# Patient Record
Sex: Male | Born: 1965 | Race: White | Hispanic: No | Marital: Married | Smoking: Never smoker
Health system: Southern US, Community
[De-identification: ages and names within clinical notes are randomized; demographics above are authoritative.]

## PROBLEM LIST (undated history)

## (undated) DIAGNOSIS — Z87442 Personal history of urinary calculi: Secondary | ICD-10-CM

## (undated) DIAGNOSIS — G4730 Sleep apnea, unspecified: Secondary | ICD-10-CM

## (undated) DIAGNOSIS — C801 Malignant (primary) neoplasm, unspecified: Secondary | ICD-10-CM

## (undated) DIAGNOSIS — F32A Depression, unspecified: Secondary | ICD-10-CM

## (undated) DIAGNOSIS — S0285XA Fracture of orbit, unspecified, initial encounter for closed fracture: Secondary | ICD-10-CM

## (undated) DIAGNOSIS — F329 Major depressive disorder, single episode, unspecified: Secondary | ICD-10-CM

## (undated) DIAGNOSIS — K219 Gastro-esophageal reflux disease without esophagitis: Secondary | ICD-10-CM

## (undated) DIAGNOSIS — I959 Hypotension, unspecified: Secondary | ICD-10-CM

## (undated) HISTORY — DX: Major depressive disorder, single episode, unspecified: F32.9

## (undated) HISTORY — DX: Sleep apnea, unspecified: G47.30

## (undated) HISTORY — DX: Depression, unspecified: F32.A

## (undated) HISTORY — PX: NOSE SURGERY: SHX723

## (undated) HISTORY — DX: Gastro-esophageal reflux disease without esophagitis: K21.9

## (undated) HISTORY — PX: ESOPHAGOGASTRODUODENOSCOPY: SHX1529

## (undated) HISTORY — DX: Fracture of orbit, unspecified, initial encounter for closed fracture: S02.85XA

---
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
## 2001-05-17 HISTORY — PX: OTHER SURGICAL HISTORY: SHX169

## 2005-05-17 HISTORY — PX: TONSILLECTOMY: SUR1361

## 2006-03-07 ENCOUNTER — Ambulatory Visit: Payer: Self-pay | Admitting: Unknown Physician Specialty

## 2010-09-21 ENCOUNTER — Other Ambulatory Visit: Payer: Self-pay | Admitting: Orthopaedic Surgery

## 2010-09-21 DIAGNOSIS — M79673 Pain in unspecified foot: Secondary | ICD-10-CM

## 2010-09-21 DIAGNOSIS — M545 Low back pain, unspecified: Secondary | ICD-10-CM

## 2010-09-28 ENCOUNTER — Other Ambulatory Visit: Payer: Self-pay

## 2010-09-29 ENCOUNTER — Other Ambulatory Visit: Payer: Self-pay

## 2011-05-03 ENCOUNTER — Ambulatory Visit: Payer: Self-pay | Admitting: Family Medicine

## 2013-01-04 ENCOUNTER — Encounter: Payer: Self-pay | Admitting: *Deleted

## 2013-01-24 ENCOUNTER — Ambulatory Visit: Payer: Self-pay | Admitting: General Surgery

## 2013-07-04 ENCOUNTER — Ambulatory Visit: Payer: Self-pay | Admitting: Gastroenterology

## 2015-01-03 DIAGNOSIS — F32A Depression, unspecified: Secondary | ICD-10-CM | POA: Insufficient documentation

## 2015-01-03 DIAGNOSIS — F329 Major depressive disorder, single episode, unspecified: Secondary | ICD-10-CM | POA: Insufficient documentation

## 2015-01-06 ENCOUNTER — Ambulatory Visit (INDEPENDENT_AMBULATORY_CARE_PROVIDER_SITE_OTHER): Payer: Managed Care, Other (non HMO) | Admitting: Family Medicine

## 2015-01-06 ENCOUNTER — Encounter: Payer: Self-pay | Admitting: Family Medicine

## 2015-01-06 VITALS — BP 126/79 | HR 67 | Temp 98.2°F | Ht 74.0 in | Wt 225.0 lb

## 2015-01-06 DIAGNOSIS — F329 Major depressive disorder, single episode, unspecified: Secondary | ICD-10-CM | POA: Diagnosis not present

## 2015-01-06 DIAGNOSIS — F32A Depression, unspecified: Secondary | ICD-10-CM

## 2015-01-06 MED ORDER — ARIPIPRAZOLE 5 MG PO TABS: 5.0000 mg | ORAL_TABLET | Freq: Every day | ORAL | Status: DC

## 2015-01-06 NOTE — Assessment & Plan Note (Addendum)
Patient possibly having bipolar 2 features also Will add on Abilify Discussed bipolar 2 Discussed stress stress reduction techniques Discuss possibility of psychiatric referral

## 2015-01-06 NOTE — Progress Notes (Signed)
   BP 126/79 mmHg  Pulse 67  Temp(Src) 98.2 F (36.8 C)  Ht 6\' 2"  (1.88 m)  Wt 225 lb (102.059 kg)  BMI 28.88 kg/m2  SpO2 97%   Subjective:    Patient ID: Scott Chan, male    DOB: Mar 06, 1966, 49 y.o.   MRN: [id number]  HPI: Scott Chan is a 49 y.o. male  Chief Complaint  Patient presents with  . Depression   Concerned nerves are worse PHQ9 score of 12 on lexapro 10mg  Has a strong family history of bipolar and has some cycling mood Patient's score of 6 on the MDQ  Relevant past medical, surgical, family and social history reviewed and updated as indicated. Interim medical history since our last visit reviewed. Allergies and medications reviewed and updated.  Review of Systems  Constitutional: Negative.   Respiratory: Negative.   Cardiovascular: Negative.   Psychiatric/Behavioral: Positive for sleep disturbance and agitation. Negative for suicidal ideas, behavioral problems, confusion and dysphoric mood. The patient is nervous/anxious. The patient is not hyperactive.     Per HPI unless specifically indicated above     Objective:    BP 126/79 mmHg  Pulse 67  Temp(Src) 98.2 F (36.8 C)  Ht 6\' 2"  (1.88 m)  Wt 225 lb (102.059 kg)  BMI 28.88 kg/m2  SpO2 97%  Wt Readings from Last 3 Encounters:  01/06/15 225 lb (102.059 kg)  09/04/14 227 lb (102.967 kg)    Physical Exam  Constitutional: He is oriented to person, place, and time. He appears well-developed and well-nourished. No distress.  HENT:  Head: Normocephalic and atraumatic.  Right Ear: Hearing normal.  Left Ear: Hearing normal.  Nose: Nose normal.  Eyes: Conjunctivae and lids are normal. Right eye exhibits no discharge. Left eye exhibits no discharge. No scleral icterus.  Cardiovascular: Normal rate, regular rhythm and normal heart sounds.   Pulmonary/Chest: Effort normal and breath sounds normal. No respiratory distress.  Musculoskeletal: Normal range of motion.  Neurological: He is alert and  oriented to person, place, and time.  Skin: Skin is intact. No rash noted.  Psychiatric: He has a normal mood and affect. His speech is normal and behavior is normal. Judgment and thought content normal. Cognition and memory are normal.    No results found for this or any previous visit.    Assessment & Plan:   Problem List Items Addressed This Visit      Other   Depression - Primary    Patient possibly having bipolar 2 features also Will add on Abilify Discussed bipolar 2          Follow up plan: Return in about 2 weeks (around 01/20/2015), or if symptoms worsen or fail to improve, for Follow-upMedications.

## 2015-01-15 ENCOUNTER — Encounter: Payer: Self-pay | Admitting: Family Medicine

## 2015-01-15 ENCOUNTER — Ambulatory Visit (INDEPENDENT_AMBULATORY_CARE_PROVIDER_SITE_OTHER): Payer: Managed Care, Other (non HMO) | Admitting: Family Medicine

## 2015-01-15 VITALS — BP 118/79 | HR 81 | Temp 97.8°F | Ht 74.0 in | Wt 222.0 lb

## 2015-01-15 DIAGNOSIS — F32A Depression, unspecified: Secondary | ICD-10-CM

## 2015-01-15 DIAGNOSIS — M25511 Pain in right shoulder: Secondary | ICD-10-CM

## 2015-01-15 DIAGNOSIS — F329 Major depressive disorder, single episode, unspecified: Secondary | ICD-10-CM

## 2015-01-15 MED ORDER — ZOLPIDEM TARTRATE 10 MG PO TABS: 10.0000 mg | ORAL_TABLET | Freq: Every evening | ORAL | Status: DC | PRN

## 2015-01-15 NOTE — Assessment & Plan Note (Signed)
Patient with chronic right shoulder pain most likely torn rotator cuff Will refer to [facility] to further evaluate.

## 2015-01-15 NOTE — Assessment & Plan Note (Signed)
Continue current medications increasing activities

## 2015-01-15 NOTE — Progress Notes (Signed)
   BP 118/79 mmHg  Pulse 81  Temp(Src) 97.8 F (36.6 C)  Ht 6\' 2"  (1.88 m)  Wt 222 lb (100.699 kg)  BMI 28.49 kg/m2  SpO2 98%   Subjective:    Patient ID: Scott Chan, male    DOB: [birth year], 49 y.o.   MRN: [id number]  HPI: Scott Chan is a 49 y.o. male  Chief Complaint  Patient presents with  . Depression   Patient recheck due to cost was not started on Abilify is taking Brintellix 5 mg Taking without side effects has had slow improvement of symptoms PH 29 score was 12 now with score of 6.  One of patient's remaining issues is sleep. On further discussion sleep is interrupted by his right shoulder which wakes him at night frequently positionally reaching in the back seat about kills him. This is been ongoing close to a year and symptoms got a give. Patient ready to see orthopedics  Relevant past medical, surgical, family and social history reviewed and updated as indicated. Interim medical history since our last visit reviewed. Allergies and medications reviewed and updated.  Review of Systems  Constitutional: Negative.   Respiratory: Negative.   Cardiovascular: Negative.     Per HPI unless specifically indicated above     Objective:    BP 118/79 mmHg  Pulse 81  Temp(Src) 97.8 F (36.6 C)  Ht 6\' 2"  (1.88 m)  Wt 222 lb (100.699 kg)  BMI 28.49 kg/m2  SpO2 98%  Wt Readings from Last 3 Encounters:  01/15/15 222 lb (100.699 kg)  01/06/15 225 lb (102.059 kg)  09/04/14 227 lb (102.967 kg)    Physical Exam  Constitutional: He is oriented to person, place, and time. He appears well-developed and well-nourished. No distress.  HENT:  Head: Normocephalic and atraumatic.  Right Ear: Hearing normal.  Left Ear: Hearing normal.  Nose: Nose normal.  Eyes: Conjunctivae and lids are normal. Right eye exhibits no discharge. Left eye exhibits no discharge. No scleral icterus.  Cardiovascular: Normal rate, regular rhythm and normal heart sounds.    Pulmonary/Chest: Effort normal and breath sounds normal. No respiratory distress.  Musculoskeletal: Normal range of motion.  Shoulder with decreased range of motion and on isolation of rotator cuff painful  Neurological: He is alert and oriented to person, place, and time.  Skin: Skin is intact. No rash noted.  Psychiatric: He has a normal mood and affect. His speech is normal and behavior is normal. Judgment and thought content normal. Cognition and memory are normal.    No results found for this or any previous visit.    Assessment & Plan:   Problem List Items Addressed This Visit      Other   Depression - Primary    Continue current medications increasing activities      Shoulder pain, right    Patient with chronic right shoulder pain most likely torn rotator cuff Will refer to [facility] to further evaluate.      Relevant Orders   Ambulatory referral to Orthopedic Surgery       Follow up plan: Return in about 2 months (around 03/17/2015), or if symptoms worsen or fail to improve, for med check.

## 2015-03-13 ENCOUNTER — Telehealth: Payer: Self-pay

## 2015-03-13 MED ORDER — ARIPIPRAZOLE 5 MG PO TABS: 5.0000 mg | ORAL_TABLET | Freq: Every day | ORAL | Status: DC

## 2015-03-13 NOTE — Telephone Encounter (Signed)
Medicap requesting   Aripiprazole 5mg  Tab

## 2015-03-17 ENCOUNTER — Encounter: Payer: Self-pay | Admitting: Family Medicine

## 2015-03-17 ENCOUNTER — Ambulatory Visit: Payer: Managed Care, Other (non HMO) | Admitting: Family Medicine

## 2015-03-17 ENCOUNTER — Ambulatory Visit (INDEPENDENT_AMBULATORY_CARE_PROVIDER_SITE_OTHER): Payer: Managed Care, Other (non HMO) | Admitting: Family Medicine

## 2015-03-17 VITALS — BP 136/87 | HR 76 | Temp 97.7°F | Ht 74.0 in | Wt 223.0 lb

## 2015-03-17 DIAGNOSIS — F329 Major depressive disorder, single episode, unspecified: Secondary | ICD-10-CM

## 2015-03-17 DIAGNOSIS — F32A Depression, unspecified: Secondary | ICD-10-CM

## 2015-03-17 MED ORDER — ARIPIPRAZOLE 5 MG PO TABS: 5.0000 mg | ORAL_TABLET | Freq: Every day | ORAL | Status: DC

## 2015-03-17 MED ORDER — ESCITALOPRAM OXALATE 10 MG PO TABS
10.0000 mg | ORAL_TABLET | Freq: Every day | ORAL | Status: DC
Start: 2015-03-17 — End: 2015-09-08

## 2015-03-17 NOTE — Assessment & Plan Note (Signed)
The current medical regimen is effective;  continue present plan and medications.  

## 2015-03-17 NOTE — Progress Notes (Signed)
   BP 136/87 mmHg  Pulse 76  Temp(Src) 97.7 F (36.5 C)  Ht 6\' 2"  (1.88 m)  Wt 223 lb (101.152 kg)  BMI 28.62 kg/m2  SpO2 99%   Subjective:    Patient ID: Scott Chan, male    DOB: 31-Oct-1965, 49 y.o.   MRN: [id number]  HPI: Scott Chan is a 49 y.o. male  Chief Complaint  Patient presents with  . Depression   patient reports about 70-80% improved on Abilify 5 mg having no side effects. No complaints from coworkers. Sleep doing better and just got a CPAP so that is going well. May have some side effects of some occasional restlessness  Relevant past medical, surgical, family and social history reviewed and updated as indicated. Interim medical history since our last visit reviewed. Allergies and medications reviewed and updated.  Review of Systems  Constitutional: Negative.   Respiratory: Negative.   Cardiovascular: Negative.     Per HPI unless specifically indicated above     Objective:    BP 136/87 mmHg  Pulse 76  Temp(Src) 97.7 F (36.5 C)  Ht 6\' 2"  (1.88 m)  Wt 223 lb (101.152 kg)  BMI 28.62 kg/m2  SpO2 99%  Wt Readings from Last 3 Encounters:  03/17/15 223 lb (101.152 kg)  01/15/15 222 lb (100.699 kg)  01/06/15 225 lb (102.059 kg)    Physical Exam  Constitutional: He is oriented to person, place, and time. He appears well-developed and well-nourished. No distress.  HENT:  Head: Normocephalic and atraumatic.  Right Ear: Hearing normal.  Left Ear: Hearing normal.  Nose: Nose normal.  Eyes: Conjunctivae and lids are normal. Right eye exhibits no discharge. Left eye exhibits no discharge. No scleral icterus.  Cardiovascular: Normal rate, regular rhythm and normal heart sounds.   Pulmonary/Chest: Effort normal and breath sounds normal. No respiratory distress.  Musculoskeletal: Normal range of motion.  Neurological: He is alert and oriented to person, place, and time.  Skin: Skin is intact. No rash noted.  Psychiatric: He has a normal mood and  affect. His speech is normal and behavior is normal. Judgment and thought content normal. Cognition and memory are normal.    No results found for this or any previous visit.    Assessment & Plan:   Problem List Items Addressed This Visit      Other   Depression - Primary    The current medical regimen is effective;  continue present plan and medications.       Relevant Medications   escitalopram (LEXAPRO) 10 MG tablet       Follow up plan: Return in about 6 months (around 09/14/2015), or if symptoms worsen or fail to improve, for Physical Exam.

## 2015-07-09 ENCOUNTER — Encounter: Payer: Self-pay | Admitting: Family Medicine

## 2015-07-09 ENCOUNTER — Ambulatory Visit (INDEPENDENT_AMBULATORY_CARE_PROVIDER_SITE_OTHER): Payer: Managed Care, Other (non HMO) | Admitting: Family Medicine

## 2015-07-09 VITALS — BP 123/84 | HR 75 | Temp 98.7°F | Ht 73.0 in | Wt 215.0 lb

## 2015-07-09 DIAGNOSIS — F329 Major depressive disorder, single episode, unspecified: Secondary | ICD-10-CM

## 2015-07-09 DIAGNOSIS — F32A Depression, unspecified: Secondary | ICD-10-CM

## 2015-07-09 MED ORDER — LAMOTRIGINE 25 MG PO TABS: 25.0000 mg | ORAL_TABLET | Freq: Every day | ORAL | Status: DC

## 2015-07-09 MED ORDER — CLONAZEPAM 1 MG PO TABS: 1.0000 mg | ORAL_TABLET | Freq: Two times a day (BID) | ORAL | Status: DC | PRN

## 2015-07-09 MED ORDER — ZOLPIDEM TARTRATE ER 12.5 MG PO TBCR: 12.5000 mg | EXTENDED_RELEASE_TABLET | Freq: Every evening | ORAL | Status: DC | PRN

## 2015-07-09 NOTE — Progress Notes (Signed)
   BP 123/84 mmHg  Pulse 75  Temp(Src) 98.7 F (37.1 C)  Ht 6\' 1"  (1.854 m)  Wt 215 lb (97.523 kg)  BMI 28.37 kg/m2  SpO2 98%   Subjective:    Patient ID: Scott Chan, male    DOB: [birth year], 50 y.o.   MRN: [id number]  HPI: Scott Chan is a 50 y.o. male  Chief Complaint  Patient presents with  . Anxiety    Patient states that he thinks he had a panic attack over the weekend. He is having abdominal pain and was having diarrhea.   Patient still having a great deal of problems with depression nerves was unable to take Abilify due to increased anxiety and agitation Still concerned because of strong family history of bipolar Patient's main appointment with psychiatry coming up in 2 weeks in [facility] Patient's hoping for some different sleeping pills and anxiety medication to buy some time until his visit.  Relevant past medical, surgical, family and social history reviewed and updated as indicated. Interim medical history since our last visit reviewed. Allergies and medications reviewed and updated.  Review of Systems  Constitutional: Negative.   Respiratory: Negative.   Cardiovascular: Negative.     Per HPI unless specifically indicated above     Objective:    BP 123/84 mmHg  Pulse 75  Temp(Src) 98.7 F (37.1 C)  Ht 6\' 1"  (1.854 m)  Wt 215 lb (97.523 kg)  BMI 28.37 kg/m2  SpO2 98%  Wt Readings from Last 3 Encounters:  07/09/15 215 lb (97.523 kg)  03/17/15 223 lb (101.152 kg)  01/15/15 222 lb (100.699 kg)    Physical Exam  Constitutional: He is oriented to person, place, and time. He appears well-developed and well-nourished. No distress.  HENT:  Head: Normocephalic and atraumatic.  Right Ear: Hearing normal.  Left Ear: Hearing normal.  Nose: Nose normal.  Eyes: Conjunctivae and lids are normal. Right eye exhibits no discharge. Left eye exhibits no discharge. No scleral icterus.  Cardiovascular: Normal rate, regular rhythm and normal heart sounds.    Pulmonary/Chest: Effort normal and breath sounds normal. No respiratory distress.  Musculoskeletal: Normal range of motion.  Neurological: He is alert and oriented to person, place, and time.  Skin: Skin is intact. No rash noted.  Psychiatric: He has a normal mood and affect. His speech is normal and behavior is normal. Judgment and thought content normal. Cognition and memory are normal.    No results found for this or any previous visit.    Assessment & Plan:   Problem List Items Addressed This Visit      Other   Depression - Primary    Discussed depression not improving will change to Ambien CR to see if helps sleep discussed driving and driving risk Gave clonazepam to take on a when necessary basis limited number Will start Lamictal           Follow up plan: Return in about 3 months (around 10/06/2015) for Physical Exam.

## 2015-07-09 NOTE — Assessment & Plan Note (Signed)
Discussed depression not improving will change to Ambien CR to see if helps sleep discussed driving and driving risk Gave clonazepam to take on a when necessary basis limited number Will start Lamictal

## 2015-07-31 ENCOUNTER — Encounter: Payer: Self-pay | Admitting: Family Medicine

## 2015-07-31 ENCOUNTER — Ambulatory Visit (INDEPENDENT_AMBULATORY_CARE_PROVIDER_SITE_OTHER): Payer: Managed Care, Other (non HMO) | Admitting: Family Medicine

## 2015-07-31 VITALS — BP 118/78 | HR 80 | Temp 98.6°F | Ht 73.5 in | Wt 204.0 lb

## 2015-07-31 DIAGNOSIS — F32A Depression, unspecified: Secondary | ICD-10-CM

## 2015-07-31 DIAGNOSIS — F329 Major depressive disorder, single episode, unspecified: Secondary | ICD-10-CM | POA: Diagnosis not present

## 2015-07-31 DIAGNOSIS — G4730 Sleep apnea, unspecified: Secondary | ICD-10-CM | POA: Insufficient documentation

## 2015-07-31 MED ORDER — LAMOTRIGINE 100 MG PO TABS: 100.0000 mg | ORAL_TABLET | Freq: Every day | ORAL | Status: DC

## 2015-07-31 NOTE — Progress Notes (Signed)
   BP 118/78 mmHg  Pulse 80  Temp(Src) 98.6 F (37 C)  Ht 6' 1.5" (1.867 m)  Wt 204 lb (92.534 kg)  BMI 26.55 kg/m2  SpO2 98%   Subjective:    Patient ID: Scott Chan, male    DOB: 02/28/1966, 50 y.o.   MRN: [id number]  HPI: Scott Chan is a 50 y.o. male  Chief Complaint  Patient presents with  . rash or "rashy feeling"    x 1 week   agent on Lamictal titration concerned may be having rash from a mental. Patient with no skin changes other than some slight itchiness that he is noticed and some slight flushing of his face. There are no other rash-type changes or skin changes Reviewed psychiatry notes and continued Lexapro 10 may increase to 20 and increase Lamictal to 100 mg as per titration. Patient otherwise doing well.  Relevant past medical, surgical, family and social history reviewed and updated as indicated. Interim medical history since our last visit reviewed. Allergies and medications reviewed and updated.  Review of Systems  Constitutional: Negative.   Respiratory: Negative.   Cardiovascular: Negative.     Per HPI unless specifically indicated above     Objective:    BP 118/78 mmHg  Pulse 80  Temp(Src) 98.6 F (37 C)  Ht 6' 1.5" (1.867 m)  Wt 204 lb (92.534 kg)  BMI 26.55 kg/m2  SpO2 98%  Wt Readings from Last 3 Encounters:  07/31/15 204 lb (92.534 kg)  07/09/15 215 lb (97.523 kg)  03/17/15 223 lb (101.152 kg)    Physical Exam  Constitutional: He is oriented to person, place, and time. He appears well-developed and well-nourished. No distress.  HENT:  Head: Normocephalic and atraumatic.  Right Ear: Hearing normal.  Left Ear: Hearing normal.  Nose: Nose normal.  Eyes: Conjunctivae and lids are normal. Right eye exhibits no discharge. Left eye exhibits no discharge. No scleral icterus.  Cardiovascular: Normal rate, regular rhythm and normal heart sounds.   Pulmonary/Chest: Effort normal and breath sounds normal. No respiratory distress.   Abdominal: Soft. Bowel sounds are normal. He exhibits no distension. There is no tenderness.  Musculoskeletal: Normal range of motion.  Neurological: He is alert and oriented to person, place, and time.  Skin: Skin is warm and intact. No rash noted. No erythema. No pallor.  No skin changes noted  Psychiatric: He has a normal mood and affect. His speech is normal and behavior is normal. Judgment and thought content normal. Cognition and memory are normal.    No results found for this or any previous visit.    Assessment & Plan:   Problem List Items Addressed This Visit      Other   Depression - Primary    Stable with Lamictal titration no evidence of skin rash Gave prescription to go to 200 mg Lamictal after 100 mg for one week then 200 mg      Sleep apnea    Reviewed sleep apnea patient working with [facility] and Dr. to get CPAP adjusted          Follow up plan: Return for Physical Exam scheduled next month.

## 2015-07-31 NOTE — Assessment & Plan Note (Signed)
Reviewed sleep apnea patient working with [facility] and Dr. to get CPAP adjusted

## 2015-07-31 NOTE — Assessment & Plan Note (Signed)
Stable with Lamictal titration no evidence of skin rash Gave prescription to go to 200 mg Lamictal after 100 mg for one week then 200 mg

## 2015-09-08 ENCOUNTER — Ambulatory Visit (INDEPENDENT_AMBULATORY_CARE_PROVIDER_SITE_OTHER): Payer: Managed Care, Other (non HMO) | Admitting: Family Medicine

## 2015-09-08 ENCOUNTER — Encounter: Payer: Self-pay | Admitting: Family Medicine

## 2015-09-08 VITALS — BP 114/77 | HR 65 | Temp 97.9°F | Ht 72.6 in | Wt 201.0 lb

## 2015-09-08 DIAGNOSIS — E291 Testicular hypofunction: Secondary | ICD-10-CM | POA: Diagnosis not present

## 2015-09-08 DIAGNOSIS — Z0000 Encounter for general adult medical examination without abnormal findings: Secondary | ICD-10-CM

## 2015-09-08 DIAGNOSIS — F329 Major depressive disorder, single episode, unspecified: Secondary | ICD-10-CM | POA: Diagnosis not present

## 2015-09-08 DIAGNOSIS — G4730 Sleep apnea, unspecified: Secondary | ICD-10-CM

## 2015-09-08 DIAGNOSIS — F32A Depression, unspecified: Secondary | ICD-10-CM

## 2015-09-08 DIAGNOSIS — K219 Gastro-esophageal reflux disease without esophagitis: Secondary | ICD-10-CM | POA: Insufficient documentation

## 2015-09-08 LAB — URINALYSIS, ROUTINE W REFLEX MICROSCOPIC
Bilirubin, UA: NEGATIVE
Glucose, UA: NEGATIVE
Ketones, UA: NEGATIVE
LEUKOCYTES UA: NEGATIVE
Nitrite, UA: NEGATIVE
PH UA: 6 (ref 5.0–7.5)
PROTEIN UA: NEGATIVE
RBC, UA: NEGATIVE
Specific Gravity, UA: 1.02 (ref 1.005–1.030)
Urobilinogen, Ur: 0.2 mg/dL (ref 0.2–1.0)

## 2015-09-08 MED ORDER — ESCITALOPRAM OXALATE 10 MG PO TABS: 10.0000 mg | ORAL_TABLET | Freq: Every day | ORAL | Status: DC

## 2015-09-08 MED ORDER — OMEPRAZOLE 20 MG PO CPDR: 20.0000 mg | DELAYED_RELEASE_CAPSULE | Freq: Every day | ORAL | Status: DC | PRN

## 2015-09-08 MED ORDER — LAMOTRIGINE 100 MG PO TABS: 100.0000 mg | ORAL_TABLET | Freq: Every day | ORAL | Status: DC

## 2015-09-08 NOTE — Assessment & Plan Note (Signed)
The current medical regimen is effective;  continue present plan and medications.  

## 2015-09-08 NOTE — Progress Notes (Signed)
BP 114/77 mmHg  Pulse 65  Temp(Src) 97.9 F (36.6 C)  Ht 6' 0.6" (1.844 m)  Wt 201 lb (91.173 kg)  BMI 26.81 kg/m2  SpO2 99%   Subjective:    Patient ID: Scott Chan, male    DOB: 1965-11-27, 50 y.o.   MRN: [id number]  HPI: CAPRI SADLIER is a 50 y.o. male  Chief Complaint  Patient presents with  . Annual Exam   Doing well with medications using rare Ambien and even less clonazepam. Nerves doing well with Lamictal and Lexapro. Using AndroGel without problems Relevant past medical, surgical, family and social history reviewed and updated as indicated. Interim medical history since our last visit reviewed. Allergies and medications reviewed and updated.  Review of Systems  Constitutional: Negative.   HENT: Negative.   Eyes: Negative.   Respiratory: Negative.   Cardiovascular: Negative.   Gastrointestinal: Negative.   Endocrine: Negative.   Genitourinary: Negative.   Musculoskeletal: Negative.   Skin: Negative.   Allergic/Immunologic: Negative.   Neurological: Negative.   Hematological: Negative.   Psychiatric/Behavioral: Negative.     Per HPI unless specifically indicated above     Objective:    BP 114/77 mmHg  Pulse 65  Temp(Src) 97.9 F (36.6 C)  Ht 6' 0.6" (1.844 m)  Wt 201 lb (91.173 kg)  BMI 26.81 kg/m2  SpO2 99%  Wt Readings from Last 3 Encounters:  09/08/15 201 lb (91.173 kg)  07/31/15 204 lb (92.534 kg)  07/09/15 215 lb (97.523 kg)    Physical Exam  Constitutional: He is oriented to person, place, and time. He appears well-developed and well-nourished.  HENT:  Head: Normocephalic and atraumatic.  Right Ear: External ear normal.  Left Ear: External ear normal.  Eyes: Conjunctivae and EOM are normal. Pupils are equal, round, and reactive to light.  Neck: Normal range of motion. Neck supple.  Cardiovascular: Normal rate, regular rhythm, normal heart sounds and intact distal pulses.   Pulmonary/Chest: Effort normal and breath sounds  normal.  Abdominal: Soft. Bowel sounds are normal. There is no splenomegaly or hepatomegaly.  Genitourinary: Rectum normal, prostate normal and penis normal.  Musculoskeletal: Normal range of motion.  Neurological: He is alert and oriented to person, place, and time. He has normal reflexes.  Skin: No rash noted. No erythema.  Psychiatric: He has a normal mood and affect. His behavior is normal. Judgment and thought content normal.    No results found for this or any previous visit.    Assessment & Plan:   Problem List Items Addressed This Visit      Digestive   GERD (gastroesophageal reflux disease)   Relevant Medications   omeprazole (PRILOSEC) 20 MG capsule     Endocrine   Hypogonadism in male    The current medical regimen is effective;  continue present plan and medications.       Relevant Orders   Testosterone     Other   Depression - Primary    The current medical regimen is effective;  continue present plan and medications.       Relevant Medications   escitalopram (LEXAPRO) 10 MG tablet   Sleep apnea    The current medical regimen is effective;  continue present plan and medications.        Other Visit Diagnoses    PE (physical exam), annual        Relevant Orders    Comprehensive metabolic panel    Lipid panel    CBC with  Differential/Platelet    TSH    Urinalysis, Routine w reflex microscopic (not at [facility])    PSA        Follow up plan: Return in about 6 months (around 03/09/2016) for med check.

## 2015-09-09 ENCOUNTER — Encounter: Payer: Self-pay | Admitting: Family Medicine

## 2015-09-09 LAB — COMPREHENSIVE METABOLIC PANEL
A/G RATIO: 2 (ref 1.2–2.2)
ALBUMIN: 4.5 g/dL (ref 3.5–5.5)
ALT: 19 IU/L (ref 0–44)
AST: 16 IU/L (ref 0–40)
Alkaline Phosphatase: 90 IU/L (ref 39–117)
BILIRUBIN TOTAL: 0.6 mg/dL (ref 0.0–1.2)
BUN / CREAT RATIO: 19 (ref 9–20)
BUN: 18 mg/dL (ref 6–24)
CALCIUM: 9.2 mg/dL (ref 8.7–10.2)
CHLORIDE: 100 mmol/L (ref 96–106)
CO2: 22 mmol/L (ref 18–29)
Creatinine, Ser: 0.96 mg/dL (ref 0.76–1.27)
GFR, EST AFRICAN AMERICAN: 107 mL/min/{1.73_m2} (ref >59)
GFR, EST NON AFRICAN AMERICAN: 92 mL/min/{1.73_m2} (ref >59)
GLUCOSE: 90 mg/dL (ref 65–99)
Globulin, Total: 2.3 g/dL (ref 1.5–4.5)
Potassium: 4.3 mmol/L (ref 3.5–5.2)
Sodium: 142 mmol/L (ref 134–144)
TOTAL PROTEIN: 6.8 g/dL (ref 6.0–8.5)

## 2015-09-09 LAB — TESTOSTERONE: TESTOSTERONE: 529 ng/dL (ref 348–1197)

## 2015-09-09 LAB — CBC WITH DIFFERENTIAL/PLATELET
BASOS ABS: 0 10*3/uL (ref 0.0–0.2)
Basos: 0 %
EOS (ABSOLUTE): 0 10*3/uL (ref 0.0–0.4)
EOS: 1 %
HEMATOCRIT: 44.2 % (ref 37.5–51.0)
Hemoglobin: 14.6 g/dL (ref 12.6–17.7)
IMMATURE GRANULOCYTES: 0 %
Immature Grans (Abs): 0 10*3/uL (ref 0.0–0.1)
LYMPHS ABS: 1.5 10*3/uL (ref 0.7–3.1)
Lymphs: 28 %
MCH: 29 pg (ref 26.6–33.0)
MCHC: 33 g/dL (ref 31.5–35.7)
MCV: 88 fL (ref 79–97)
MONOS ABS: 0.4 10*3/uL (ref 0.1–0.9)
Monocytes: 7 %
NEUTROS PCT: 64 %
Neutrophils Absolute: 3.4 10*3/uL (ref 1.4–7.0)
PLATELETS: 209 10*3/uL (ref 150–379)
RBC: 5.04 x10E6/uL (ref 4.14–5.80)
RDW: 13.8 % (ref 12.3–15.4)
WBC: 5.3 10*3/uL (ref 3.4–10.8)

## 2015-09-09 LAB — LIPID PANEL
CHOL/HDL RATIO: 4.1 ratio (ref 0.0–5.0)
Cholesterol, Total: 163 mg/dL (ref 100–199)
HDL: 40 mg/dL (ref >39)
LDL Calculated: 106 mg/dL — ABNORMAL HIGH (ref 0–99)
Triglycerides: 84 mg/dL (ref 0–149)
VLDL CHOLESTEROL CAL: 17 mg/dL (ref 5–40)

## 2015-09-09 LAB — PSA: PROSTATE SPECIFIC AG, SERUM: 0.5 ng/mL (ref 0.0–4.0)

## 2015-09-09 LAB — TSH: TSH: 1.42 u[IU]/mL (ref 0.450–4.500)

## 2016-03-10 ENCOUNTER — Encounter: Payer: Self-pay | Admitting: Family Medicine

## 2016-03-10 ENCOUNTER — Ambulatory Visit (INDEPENDENT_AMBULATORY_CARE_PROVIDER_SITE_OTHER): Payer: Managed Care, Other (non HMO) | Admitting: Family Medicine

## 2016-03-10 DIAGNOSIS — F3342 Major depressive disorder, recurrent, in full remission: Secondary | ICD-10-CM | POA: Diagnosis not present

## 2016-03-10 DIAGNOSIS — E291 Testicular hypofunction: Secondary | ICD-10-CM

## 2016-03-10 DIAGNOSIS — G4730 Sleep apnea, unspecified: Secondary | ICD-10-CM | POA: Diagnosis not present

## 2016-03-10 NOTE — Assessment & Plan Note (Signed)
The current medical regimen is effective;  continue present plan and medications.  

## 2016-03-10 NOTE — Progress Notes (Addendum)
BP 127/87   Pulse 76   Temp 97.6 F (36.4 C)   Wt 212 lb (96.2 kg)   SpO2 99%   BMI 28.28 kg/m    Subjective:    Patient ID: Scott Chan, male    DOB: 1966/03/28, 50 y.o.   MRN: [id number]  HPI: Scott Chan is a 50 y.o. male  Chief Complaint  Patient presents with  . Depression  . Low Testosterone    been out of the androgel   Discussed with patient hasn't really made that much difference being off about a month. Insurance changed and medications cost too much. Reviewed nerves medication doing well with Lamictal and Lexapro no side effects doing well at work. Has not used Klonopin or Ambien. Relevant past medical, surgical, family and social history reviewed and updated as indicated. Interim medical history since our last visit reviewed. Allergies and medications reviewed and updated.  Review of Systems  Constitutional: Negative.   Respiratory: Negative.   Cardiovascular: Negative.     Per HPI unless specifically indicated above     Objective:    BP 127/87   Pulse 76   Temp 97.6 F (36.4 C)   Wt 212 lb (96.2 kg)   SpO2 99%   BMI 28.28 kg/m   Wt Readings from Last 3 Encounters:  03/10/16 212 lb (96.2 kg)  09/08/15 201 lb (91.2 kg)  07/31/15 204 lb (92.5 kg)    Physical Exam  Constitutional: He is oriented to person, place, and time. He appears well-developed and well-nourished. No distress.  HENT:  Head: Normocephalic and atraumatic.  Right Ear: Hearing normal.  Left Ear: Hearing normal.  Nose: Nose normal.  Eyes: Conjunctivae and lids are normal. Right eye exhibits no discharge. Left eye exhibits no discharge. No scleral icterus.  Cardiovascular: Normal rate, regular rhythm and normal heart sounds.   Pulmonary/Chest: Effort normal and breath sounds normal. No respiratory distress.  Musculoskeletal: Normal range of motion.  Neurological: He is alert and oriented to person, place, and time.  Skin: Skin is intact. No rash noted.    Psychiatric: He has a normal mood and affect. His speech is normal and behavior is normal. Judgment and thought content normal. Cognition and memory are normal.    Results for orders placed or performed in visit on 09/08/15  Comprehensive metabolic panel  Result Value Ref Range   Glucose 90 65 - 99 mg/dL   BUN 18 6 - 24 mg/dL   Creatinine, Ser 0.96 0.76 - 1.27 mg/dL   GFR calc non Af Amer 92 >59 mL/min/1.73   GFR calc Af Amer 107 >59 mL/min/1.73   BUN/Creatinine Ratio 19 9 - 20   Sodium 142 134 - 144 mmol/L   Potassium 4.3 3.5 - 5.2 mmol/L   Chloride 100 96 - 106 mmol/L   CO2 22 18 - 29 mmol/L   Calcium 9.2 8.7 - 10.2 mg/dL   Total Protein 6.8 6.0 - 8.5 g/dL   Albumin 4.5 3.5 - 5.5 g/dL   Globulin, Total 2.3 1.5 - 4.5 g/dL   Albumin/Globulin Ratio 2.0 1.2 - 2.2   Bilirubin Total 0.6 0.0 - 1.2 mg/dL   Alkaline Phosphatase 90 39 - 117 IU/L   AST 16 0 - 40 IU/L   ALT 19 0 - 44 IU/L  Lipid panel  Result Value Ref Range   Cholesterol, Total 163 100 - 199 mg/dL   Triglycerides 84 0 - 149 mg/dL   HDL 40 >39 mg/dL  VLDL Cholesterol Cal 17 5 - 40 mg/dL   LDL Calculated 106 (H) 0 - 99 mg/dL   Chol/HDL Ratio 4.1 0.0 - 5.0 ratio units  CBC with Differential/Platelet  Result Value Ref Range   WBC 5.3 3.4 - 10.8 x10E3/uL   RBC 5.04 4.14 - 5.80 x10E6/uL   Hemoglobin 14.6 12.6 - 17.7 g/dL   Hematocrit 44.2 37.5 - 51.0 %   MCV 88 79 - 97 fL   MCH 29.0 26.6 - 33.0 pg   MCHC 33.0 31.5 - 35.7 g/dL   RDW 13.8 12.3 - 15.4 %   Platelets 209 150 - 379 x10E3/uL   Neutrophils 64 %   Lymphs 28 %   Monocytes 7 %   Eos 1 %   Basos 0 %   Neutrophils Absolute 3.4 1.4 - 7.0 x10E3/uL   Lymphocytes Absolute 1.5 0.7 - 3.1 x10E3/uL   Monocytes Absolute 0.4 0.1 - 0.9 x10E3/uL   EOS (ABSOLUTE) 0.0 0.0 - 0.4 x10E3/uL   Basophils Absolute 0.0 0.0 - 0.2 x10E3/uL   Immature Granulocytes 0 %   Immature Grans (Abs) 0.0 0.0 - 0.1 x10E3/uL  TSH  Result Value Ref Range   TSH 1.420 0.450 - 4.500 uIU/mL   Urinalysis, Routine w reflex microscopic (not at [facility])  Result Value Ref Range   Specific Gravity, UA 1.020 1.005 - 1.030   pH, UA 6.0 5.0 - 7.5   Color, UA Yellow Yellow   Appearance Ur Clear Clear   Leukocytes, UA Negative Negative   Protein, UA Negative Negative/Trace   Glucose, UA Negative Negative   Ketones, UA Negative Negative   RBC, UA Negative Negative   Bilirubin, UA Negative Negative   Urobilinogen, Ur 0.2 0.2 - 1.0 mg/dL   Nitrite, UA Negative Negative  PSA  Result Value Ref Range   Prostate Specific Ag, Serum 0.5 0.0 - 4.0 ng/mL  Testosterone  Result Value Ref Range   Testosterone 529 348 - 1,197 ng/dL   Comment, Testosterone Comment       Assessment & Plan:   Problem List Items Addressed This Visit      Respiratory   Sleep apnea    Intermittently using CPAP device reviewed with patient        Endocrine   Hypogonadism in male    Currently comfortable with no replacement therapy        Other   Depression    The current medical regimen is effective;  continue present plan and medications.        Other Visit Diagnoses   None.      Follow up plan: Return in about 6 months (around 09/08/2016) for Physical Exam.

## 2016-03-10 NOTE — Assessment & Plan Note (Signed)
Currently comfortable with no replacement therapy

## 2016-03-10 NOTE — Assessment & Plan Note (Signed)
Intermittently using CPAP device reviewed with patient

## 2016-07-01 ENCOUNTER — Ambulatory Visit (INDEPENDENT_AMBULATORY_CARE_PROVIDER_SITE_OTHER): Payer: Managed Care, Other (non HMO) | Admitting: Family Medicine

## 2016-07-01 ENCOUNTER — Encounter: Payer: Self-pay | Admitting: Family Medicine

## 2016-07-01 VITALS — BP 116/83 | HR 75 | Ht 73.0 in | Wt 213.0 lb

## 2016-07-01 DIAGNOSIS — F3342 Major depressive disorder, recurrent, in full remission: Secondary | ICD-10-CM | POA: Diagnosis not present

## 2016-07-01 DIAGNOSIS — Z1211 Encounter for screening for malignant neoplasm of colon: Secondary | ICD-10-CM

## 2016-07-01 DIAGNOSIS — E291 Testicular hypofunction: Secondary | ICD-10-CM | POA: Diagnosis not present

## 2016-07-01 DIAGNOSIS — R079 Chest pain, unspecified: Secondary | ICD-10-CM

## 2016-07-01 LAB — EKG 12-LEAD

## 2016-07-01 NOTE — Assessment & Plan Note (Signed)
Nonspecific chest pain that comes on with exertion Will refer to cardiology to further evaluate

## 2016-07-01 NOTE — Assessment & Plan Note (Signed)
Stable on current medicines don't feel will make total is causing patient any significant problems will observe can take Allegra as needed. If symptoms change discuss tapering and stopping Lamictal.

## 2016-07-01 NOTE — Progress Notes (Signed)
BP 116/83   Pulse 75   Ht 6\' 1"  (1.854 m)   Wt 213 lb (96.6 kg)   SpO2 97%   BMI 28.10 kg/m    Subjective:    Patient ID: Scott Chan, male    DOB: 05/04/1966, 51 y.o.   MRN: [id number]  HPI: Scott Chan is a 50 y.o. male  Chief Complaint  Patient presents with  . Follow-up  . Medication Reaction    Lamictal.   Patient with some complaints of chest pain with exertion comes unpredictably with exercise goes away with rest no real radiation into neck or shoulder no diaphoresis associated has been ongoing for several weeks not really getting worse.  Follow up plan mental doing good job for his nerves that 100 mg with no complaints but has some occasional itch and discomfort in his skin was taking Allegra which maybe is helped with no overt rash or other changes  Relevant past medical, surgical, family and social history reviewed and updated as indicated. Interim medical history since our last visit reviewed. Allergies and medications reviewed and updated.  Review of Systems  Constitutional: Negative.   Respiratory: Negative.   Cardiovascular: Negative for palpitations and leg swelling.    Per HPI unless specifically indicated above     Objective:    BP 116/83   Pulse 75   Ht 6\' 1"  (1.854 m)   Wt 213 lb (96.6 kg)   SpO2 97%   BMI 28.10 kg/m   Wt Readings from Last 3 Encounters:  07/01/16 213 lb (96.6 kg)  03/10/16 212 lb (96.2 kg)  09/08/15 201 lb (91.2 kg)    Physical Exam  Constitutional: He is oriented to person, place, and time. He appears well-developed and well-nourished. No distress.  HENT:  Head: Normocephalic and atraumatic.  Right Ear: Hearing normal.  Left Ear: Hearing normal.  Nose: Nose normal.  Eyes: Conjunctivae and lids are normal. Right eye exhibits no discharge. Left eye exhibits no discharge. No scleral icterus.  Cardiovascular: Normal rate and normal heart sounds.   Pulmonary/Chest: Effort normal and breath sounds normal. No  respiratory distress.  Musculoskeletal: Normal range of motion.  Neurological: He is alert and oriented to person, place, and time.  Skin: Skin is intact. No rash noted.  Psychiatric: He has a normal mood and affect. His speech is normal and behavior is normal. Judgment and thought content normal. Cognition and memory are normal.    Results for orders placed or performed in visit on 07/01/16  EKG 12-Lead  Result Value Ref Range   FINAL DIAGNOSIS:       EKG no acute changes Assessment & Plan:   Problem List Items Addressed This Visit      Endocrine   Hypogonadism in male    Is interested in getting started back on medication can do shots patient will review with his urologist.        Other   Depression    Stable on current medicines don't feel will make total is causing patient any significant problems will observe can take Allegra as needed. If symptoms change discuss tapering and stopping Lamictal.      Chest pain    Nonspecific chest pain that comes on with exertion Will refer to cardiology to further evaluate      Relevant Orders   EKG 12-Lead (Completed)   Ambulatory referral to Cardiology    Other Visit Diagnoses    Colon cancer screening    -  Primary       Follow up plan: Return for Physical Exam April or May.

## 2016-07-01 NOTE — Assessment & Plan Note (Signed)
Is interested in getting started back on medication can do shots patient will review with his urologist.

## 2016-07-06 ENCOUNTER — Ambulatory Visit: Payer: Self-pay | Admitting: Cardiology

## 2016-07-08 ENCOUNTER — Ambulatory Visit (INDEPENDENT_AMBULATORY_CARE_PROVIDER_SITE_OTHER): Payer: Managed Care, Other (non HMO) | Admitting: Cardiology

## 2016-07-08 ENCOUNTER — Encounter: Payer: Self-pay | Admitting: Cardiology

## 2016-07-08 VITALS — BP 128/88 | HR 76 | Ht 73.0 in | Wt 225.2 lb

## 2016-07-08 DIAGNOSIS — G4733 Obstructive sleep apnea (adult) (pediatric): Secondary | ICD-10-CM | POA: Diagnosis not present

## 2016-07-08 DIAGNOSIS — R079 Chest pain, unspecified: Secondary | ICD-10-CM

## 2016-07-08 DIAGNOSIS — Z8249 Family history of ischemic heart disease and other diseases of the circulatory system: Secondary | ICD-10-CM

## 2016-07-08 NOTE — Patient Instructions (Addendum)
Medication Instructions:  No changes  Labwork: None today  Testing/Procedures: Your physician has requested that you have an echocardiogram. Echocardiography is a painless test that uses sound waves to create images of your heart. It provides your doctor with information about the size and shape of your heart and how well your heart's chambers and valves are working. This procedure takes approximately one hour. There are no restrictions for this procedure.  [place]  Your caregiver has ordered a Stress Test with nuclear imaging. The purpose of this test is to evaluate the blood supply to your heart muscle. This procedure is referred to as a "Non-Invasive Stress Test." This is because other than having an IV started in your vein, nothing is inserted or "invades" your body. Cardiac stress tests are done to find areas of poor blood flow to the heart by determining the extent of coronary artery disease (CAD).    Please note: these test may take anywhere between 2-4 hours to complete  PLEASE REPORT TO [place] AT THE FIRST DESK WILL DIRECT YOU WHERE TO GO  Date of Procedure:_Monday July 19, 2016 at 07:30AM__  Arrival Time for Procedure:_Arrive at 07:15AM to register__   PLEASE NOTIFY THE OFFICE AT LEAST 24 HOURS IN ADVANCE IF YOU ARE UNABLE TO [place].  [phone] AND  PLEASE NOTIFY NUCLEAR MEDICINE AT [facility] AT LEAST 24 HOURS IN ADVANCE IF YOU ARE UNABLE TO KEEP YOUR APPOINTMENT. [phone]  How to prepare for your Myoview test:  1. Do not eat or drink after midnight 2. No caffeine for 24 hours prior to test 3. No smoking 24 hours prior to test. 4. Your medication may be taken with water.  If your doctor stopped a medication because of this test, do not take that medication. 5. Ladies, please do not wear dresses.  Skirts or pants are appropriate. Please wear a short sleeve shirt. 6. No perfume, cologne or lotion. 7. Wear  comfortable walking shoes. No heels!   Follow-Up: Your physician recommends that you schedule a follow-up appointment as needed. We will call you with results and if needed schedule follow up at that time.   It was a pleasure seeing you today here in the office. Please do not hesitate to give [place] a call back if you have any further questions. [place], BSN     Echocardiogram An echocardiogram, or echocardiography, uses sound waves (ultrasound) to produce an image of your heart. The echocardiogram is simple, painless, obtained within a short period of time, and offers valuable information to your health care provider. The images from an echocardiogram can provide information such as:  Evidence of coronary artery disease (CAD).  Heart size.  Heart muscle function.  Heart valve function.  Aneurysm detection.  Evidence of a past heart attack.  Fluid buildup around the heart.  Heart muscle thickening.  Assess heart valve function. Tell a health care provider about:  Any allergies you have.  All medicines you are taking, including vitamins, herbs, eye drops, creams, and over-the-counter medicines.  Any problems you or family members have had with anesthetic medicines.  Any blood disorders you have.  Any surgeries you have had.  Any medical conditions you have.  Whether you are pregnant or may be pregnant. What happens before the procedure? No special preparation is needed. Eat and drink normally. What happens during the procedure?  In order to produce an image of your heart, gel will be applied  to your chest and a wand-like tool (transducer) will be moved over your chest. The gel will help transmit the sound waves from the transducer. The sound waves will harmlessly bounce off your heart to allow the heart images to be captured in real-time motion. These images will then be recorded.  You may need an IV to receive a medicine that improves the quality  of the pictures. What happens after the procedure? You may return to your normal schedule including diet, activities, and medicines, unless your health care provider tells you otherwise. This information is not intended to replace advice given to you by your health care provider. Make sure you discuss any questions you have with your health care provider. Document Released: 04/30/2000 Document Revised: 12/20/2015 Document Reviewed: 01/08/2013 Elsevier Interactive Patient Education  2017 [employer]. Cardiac Nuclear Scanning A cardiac nuclear scan is used to check your heart for problems, such as the following:  A portion of the heart is not getting enough blood.  Part of the heart muscle has died, which happens with a heart attack.  The heart wall is not working normally.  In this test, a radioactive dye (tracer) is injected into your bloodstream. After the tracer has traveled to your heart, a scanning device is used to measure how much of the tracer is absorbed by or distributed to various areas of your heart. LET [facility] CARE PROVIDER KNOW ABOUT:  Any allergies you have.  All medicines you are taking, including vitamins, herbs, eye drops, creams, and over-the-counter medicines.  Previous problems you or members of your family have had with the use of anesthetics.  Any blood disorders you have.  Previous surgeries you have had.  Medical conditions you have.  RISKS AND COMPLICATIONS Generally, this is a safe procedure. However, as with any procedure, problems can occur. Possible problems include:   Serious chest pain.  Rapid heartbeat.  Sensation of warmth in your chest. This usually passes quickly. BEFORE THE PROCEDURE Ask your health care provider about changing or stopping your regular medicines. PROCEDURE This procedure is usually done at a hospital and takes 2-4 hours.  An IV tube is inserted into one of your veins.  Your health care provider will inject a  small amount of radioactive tracer through the tube.  You will then wait for 20-40 minutes while the tracer travels through your bloodstream.  You will lie down on an exam table so images of your heart can be taken. Images will be taken for about 15-20 minutes.  You will exercise on a treadmill or stationary bike. While you exercise, your heart activity will be monitored with an electrocardiogram (ECG), and your blood pressure will be checked.  If you are unable to exercise, you may be given a medicine to make your heart beat faster.  When blood flow to your heart has peaked, tracer will again be injected through the IV tube.  After 20-40 minutes, you will get back on the exam table and have more images taken of your heart.  When the procedure is over, your IV tube will be removed. AFTER THE PROCEDURE  You will likely be able to leave shortly after the test. Unless your health care provider tells you otherwise, you may return to your normal schedule, including diet, activities, and medicines.  Make sure you find out how and when you will get your test results. This information is not intended to replace advice given to you by your health care provider. Make sure you  discuss any questions you have with your health care provider. Document Released: 05/28/2004 Document Revised: 05/08/2013 Document Reviewed: 04/11/2013 Elsevier Interactive Patient Education  2017 [employer].

## 2016-07-08 NOTE — Progress Notes (Signed)
Cardiology Office Note   Date:  07/08/2016   ID:  Scott Chan, DOB 01-30-1966, MRN [id number]  Referring Doctor:  Golden Pop, MD   Cardiologist:   Wende Bushy, MD   Reason for consultation:  Chief Complaint  Patient presents with  . other    New Patient. Referred by Dr.Crissman for chest pain. Pt c/o dizziness when stands up. Reviewed meds with pt verbally.      History of Present Illness: Scott Chan is a 51 y.o. male who presents for Episodes of chest pain. 3-4 months now, sharp chest pain with exertion, sometimes radiating to left arm, while playing basketball. 5 out of 10 severity, lasting less than 1 minute. He reports presyncopal episodes sometimes with exercise, playing basketball. Shortness of breath with plain basketball as well. No symptoms at rest.  He reports being diagnosed with likely severe sleep apnea but is unable to afford the CPAP.  Patient reports history of possible MI in brother in his 47s. Stents in his father in his 60s. Patient denies smoking.  ROS:  Please see the history of present illness. Aside from mentioned under HPI, all other systems are reviewed and negative.     Past Medical History:  Diagnosis Date  . Depression   . GERD (gastroesophageal reflux disease)   . Orbital fracture ([place]) 1986  . Sleep apnea     Past Surgical History:  Procedure Laterality Date  . nephrolithics  2003  . NOSE SURGERY    . TONSILLECTOMY  2007     reports that he has never smoked. He has never used smokeless tobacco. He reports that he drinks alcohol. He reports that he does not use drugs.   family history includes Heart attack in his brother; Hypertension in his brother and father; Ovarian cancer in his mother; Prostate cancer in his brother.   Outpatient Medications Prior to Visit  Medication Sig Dispense Refill  . escitalopram (LEXAPRO) 10 MG tablet Take 1 tablet (10 mg total) by mouth daily. 30 tablet 12  . lamoTRIgine (LAMICTAL) 100  MG tablet Take 1 tablet (100 mg total) by mouth daily. 30 tablet 12  . omeprazole (PRILOSEC) 20 MG capsule Take 1 capsule (20 mg total) by mouth daily as needed. 30 capsule 12  . triamcinolone (KENALOG) 0.025 % cream daily as needed.    . zolpidem (AMBIEN CR) 12.5 MG CR tablet Take 1 tablet (12.5 mg total) by mouth at bedtime as needed for sleep. 30 tablet 0   No facility-administered medications prior to visit.      Allergies: Patient has no known allergies.    PHYSICAL EXAM: VS:  BP 128/88 (BP Location: Right Arm, Patient Position: Sitting, Cuff Size: Normal)   Pulse 76   Ht 6\' 1"  (1.854 m)   Wt 225 lb 4 oz (102.2 kg)   BMI 29.72 kg/m  , Body mass index is 29.72 kg/m. Wt Readings from Last 3 Encounters:  07/08/16 225 lb 4 oz (102.2 kg)  07/01/16 213 lb (96.6 kg)  03/10/16 212 lb (96.2 kg)    GENERAL:  well developed, well nourished,  not in acute distress HEENT: normocephalic, pink conjunctivae, anicteric sclerae, no xanthelasma, normal dentition, oropharynx clear NECK:  no neck vein engorgement, JVP normal, no hepatojugular reflux, carotid upstroke brisk and symmetric, no bruit, no thyromegaly, no lymphadenopathy LUNGS:  good respiratory effort, clear to auscultation bilaterally CV:  PMI not displaced, no thrills, no lifts, S1 and S2 within normal limits, no palpable  S3 or S4, no murmurs, no rubs, no gallops ABD:  Soft, nontender, nondistended, normoactive bowel sounds, no abdominal aortic bruit, no hepatomegaly, no splenomegaly MS: nontender back, no kyphosis, no scoliosis, no joint deformities EXT:  2+ DP/PT pulses, no edema, no varicosities, no cyanosis, no clubbing SKIN: warm, nondiaphoretic, normal turgor, no ulcers NEUROPSYCH: alert, oriented to person, place, and time, sensory/motor grossly intact, normal mood, appropriate affect  Recent Labs: 09/08/2015: ALT 19; BUN 18; Creatinine, Ser 0.96; Platelets 209; Potassium 4.3; Sodium 142; TSH 1.420   Lipid Panel      Component Value Date/Time   CHOL 163 09/08/2015 0000   TRIG 84 09/08/2015 0000   HDL 40 09/08/2015 0000   CHOLHDL 4.1 09/08/2015 0000   LDLCALC 106 (H) 09/08/2015 0000     Other studies Reviewed:  EKG:  The ekg from 07/08/2016 was personally reviewed by me and it revealed sinus rhythm, 76 BPM  Additional studies/ records that were reviewed personally reviewed by me today include: None available   ASSESSMENT AND PLAN: Chest pain with high risk associated symptoms, exertional in nature, presyncope Risk factors include premature coronary artery disease in the family, Sleep apnea untreated Recommend further evaluation with echocardiogram exercise nuclear stress test. Patient prescribed ASA, and NTG SL prn for chest pain. Patient instructed to call [phone] for unrelenting chest pain.    Current medicines are reviewed at length with the patient today.  The patient does not have concerns regarding medicines.  Labs/ tests ordered today include:  Orders Placed This Encounter  Procedures  . NM Myocar Multi W/Spect W/Wall Motion / EF  . EKG 12-Lead  . ECHOCARDIOGRAM COMPLETE    I had a lengthy and detailed discussion with the patient regarding diagnoses, prognosis, diagnostic options, treatment options , and side effects of medications.   I counseled the patient on importance of lifestyle modification including heart healthy diet, regular physical activity Was quite a workup completed.   Disposition:   FU with undersigned after tests   Thank you for this consultation. We will forwarding this consultation to referring physician.   Signed, Wende Bushy, MD  07/08/2016 5:56 PM    [place]  This note was generated in part with voice recognition software and I apologize for any typographical errors that were not detected and corrected.

## 2016-07-15 ENCOUNTER — Telehealth: Payer: Self-pay | Admitting: Cardiology

## 2016-07-15 NOTE — Telephone Encounter (Signed)
Pt would like to cancel stress test due to he has not met his deductible for this year.

## 2016-07-15 NOTE — Telephone Encounter (Signed)
Spoke with patient and he wants to cancel his stress test at this time. Let him know that he did not have to pay up front for testing and that they do allow payment plans. He verbalized understanding and stated that he would call back when ready to schedule.

## 2016-07-19 ENCOUNTER — Other Ambulatory Visit: Payer: Managed Care, Other (non HMO)

## 2016-09-08 ENCOUNTER — Ambulatory Visit (INDEPENDENT_AMBULATORY_CARE_PROVIDER_SITE_OTHER): Payer: Managed Care, Other (non HMO) | Admitting: Family Medicine

## 2016-09-08 ENCOUNTER — Encounter: Payer: Self-pay | Admitting: Family Medicine

## 2016-09-08 VITALS — HR 76 | Ht 74.02 in | Wt 213.0 lb

## 2016-09-08 DIAGNOSIS — F3342 Major depressive disorder, recurrent, in full remission: Secondary | ICD-10-CM

## 2016-09-08 DIAGNOSIS — K219 Gastro-esophageal reflux disease without esophagitis: Secondary | ICD-10-CM | POA: Diagnosis not present

## 2016-09-08 DIAGNOSIS — Z125 Encounter for screening for malignant neoplasm of prostate: Secondary | ICD-10-CM

## 2016-09-08 DIAGNOSIS — Z1329 Encounter for screening for other suspected endocrine disorder: Secondary | ICD-10-CM

## 2016-09-08 DIAGNOSIS — G4730 Sleep apnea, unspecified: Secondary | ICD-10-CM

## 2016-09-08 DIAGNOSIS — Z0000 Encounter for general adult medical examination without abnormal findings: Secondary | ICD-10-CM | POA: Diagnosis not present

## 2016-09-08 DIAGNOSIS — E291 Testicular hypofunction: Secondary | ICD-10-CM

## 2016-09-08 DIAGNOSIS — Z1211 Encounter for screening for malignant neoplasm of colon: Secondary | ICD-10-CM

## 2016-09-08 LAB — URINALYSIS, ROUTINE W REFLEX MICROSCOPIC
Bilirubin, UA: NEGATIVE
GLUCOSE, UA: NEGATIVE
Ketones, UA: NEGATIVE
Leukocytes, UA: NEGATIVE
Nitrite, UA: NEGATIVE
PROTEIN UA: NEGATIVE
RBC, UA: NEGATIVE
Specific Gravity, UA: 1.02 (ref 1.005–1.030)
UUROB: 0.2 mg/dL (ref 0.2–1.0)
pH, UA: 6 (ref 5.0–7.5)

## 2016-09-08 LAB — MICROSCOPIC EXAMINATION
Bacteria, UA: NONE SEEN
RBC, UA: NONE SEEN /hpf (ref 0–?)
WBC, UA: NONE SEEN /hpf (ref 0–?)

## 2016-09-08 MED ORDER — OMEPRAZOLE 20 MG PO CPDR: 20.0000 mg | DELAYED_RELEASE_CAPSULE | Freq: Every day | ORAL | 12 refills | Status: DC | PRN

## 2016-09-08 MED ORDER — LAMOTRIGINE 100 MG PO TABS: 100.0000 mg | ORAL_TABLET | Freq: Every day | ORAL | 12 refills | Status: DC

## 2016-09-08 MED ORDER — ESCITALOPRAM OXALATE 10 MG PO TABS: 10.0000 mg | ORAL_TABLET | Freq: Every day | ORAL | 12 refills | Status: DC

## 2016-09-08 NOTE — Progress Notes (Signed)
Pulse 76   Ht 6' 2.02" (1.88 m)   Wt 213 lb (96.6 kg)   SpO2 99%   BMI 27.34 kg/m    Subjective:    Patient ID: Scott Chan, male    DOB: 02/19/1966, 51 y.o.   MRN: [id number]  HPI: Scott Chan is a 51 y.o. male  Chief Complaint  Patient presents with  . Annual Exam  [place] all in all doing well can medications without problems reflux may be having some extra issues nerve medicines working well. Uses CPAP about half the time is sometimes doesn't fall asleep wear CPAP machine is.  Relevant past medical, surgical, family and social history reviewed and updated as indicated. Interim medical history since our last visit reviewed. Allergies and medications reviewed and updated.  Review of Systems  Constitutional: Negative.   HENT: Negative.   Eyes: Negative.   Respiratory: Negative.   Cardiovascular: Negative.   Gastrointestinal: Negative.   Endocrine: Negative.   Genitourinary: Negative.   Musculoskeletal: Negative.   Skin: Negative.   Allergic/Immunologic: Negative.   Neurological: Negative.   Hematological: Negative.   Psychiatric/Behavioral: Negative.     Per HPI unless specifically indicated above     Objective:    Pulse 76   Ht 6' 2.02" (1.88 m)   Wt 213 lb (96.6 kg)   SpO2 99%   BMI 27.34 kg/m   Wt Readings from Last 3 Encounters:  09/08/16 213 lb (96.6 kg)  07/08/16 225 lb 4 oz (102.2 kg)  07/01/16 213 lb (96.6 kg)    Physical Exam  Constitutional: He is oriented to person, place, and time. He appears well-developed and well-nourished.  HENT:  Head: Normocephalic and atraumatic.  Right Ear: External ear normal.  Left Ear: External ear normal.  Eyes: Conjunctivae and EOM are normal. Pupils are equal, round, and reactive to light.  Neck: Normal range of motion. Neck supple.  Cardiovascular: Normal rate, regular rhythm, normal heart sounds and intact distal pulses.   Pulmonary/Chest: Effort normal and breath sounds normal.  Abdominal:  Soft. Bowel sounds are normal. There is no splenomegaly or hepatomegaly.  Genitourinary:  Genitourinary Comments: At urology  Musculoskeletal: Normal range of motion.  Neurological: He is alert and oriented to person, place, and time. He has normal reflexes.  Skin: No rash noted. No erythema.  Psychiatric: He has a normal mood and affect. His behavior is normal. Judgment and thought content normal.    Results for orders placed or performed in visit on 07/01/16  EKG 12-Lead  Result Value Ref Range   FINAL DIAGNOSIS:        Assessment & Plan:   Problem List Items Addressed This Visit      Respiratory   Sleep apnea    Uses CPAP        Digestive   GERD (gastroesophageal reflux disease)    Patient follow-up with some increased reflux symptoms occasionally will need more medications      Relevant Medications   omeprazole (PRILOSEC) 20 MG capsule     Endocrine   Hypogonadism in male   Relevant Orders   PSA     Other   Depression    The current medical regimen is effective;  continue present plan and medications.       Relevant Medications   escitalopram (LEXAPRO) 10 MG tablet    Other Visit Diagnoses    Annual physical exam    -  Primary   Relevant Orders   CBC with  Differential/Platelet   Comprehensive metabolic panel   PSA   TSH   Urinalysis, Routine w reflex microscopic   PE (physical exam), annual       Relevant Orders   CBC with Differential/Platelet   Comprehensive metabolic panel   PSA   TSH   Urinalysis, Routine w reflex microscopic   Prostate cancer screening       Relevant Orders   PSA   Thyroid disorder screen       Relevant Orders   TSH   Colon cancer screening       Relevant Orders   Ambulatory referral to Gastroenterology       Follow up plan: Return in about 6 months (around 03/10/2017) for BMP.

## 2016-09-08 NOTE — Assessment & Plan Note (Signed)
The current medical regimen is effective;  continue present plan and medications.  

## 2016-09-08 NOTE — Assessment & Plan Note (Signed)
Uses CPAP 

## 2016-09-08 NOTE — Assessment & Plan Note (Signed)
Patient follow-up with some increased reflux symptoms occasionally will need more medications

## 2016-09-09 ENCOUNTER — Telehealth: Payer: Self-pay

## 2016-09-09 ENCOUNTER — Encounter: Payer: Self-pay | Admitting: Family Medicine

## 2016-09-09 ENCOUNTER — Other Ambulatory Visit: Payer: Self-pay

## 2016-09-09 DIAGNOSIS — Z1211 Encounter for screening for malignant neoplasm of colon: Secondary | ICD-10-CM

## 2016-09-09 LAB — COMPREHENSIVE METABOLIC PANEL
ALBUMIN: 4.5 g/dL (ref 3.5–5.5)
ALK PHOS: 90 IU/L (ref 39–117)
ALT: 20 IU/L (ref 0–44)
AST: 22 IU/L (ref 0–40)
Albumin/Globulin Ratio: 2 (ref 1.2–2.2)
BUN / CREAT RATIO: 19 (ref 9–20)
BUN: 18 mg/dL (ref 6–24)
Bilirubin Total: 0.5 mg/dL (ref 0.0–1.2)
CHLORIDE: 101 mmol/L (ref 96–106)
CO2: 24 mmol/L (ref 18–29)
Calcium: 9.5 mg/dL (ref 8.7–10.2)
Creatinine, Ser: 0.97 mg/dL (ref 0.76–1.27)
GFR calc Af Amer: 105 mL/min/{1.73_m2} (ref >59)
GFR calc non Af Amer: 91 mL/min/{1.73_m2} (ref >59)
GLOBULIN, TOTAL: 2.2 g/dL (ref 1.5–4.5)
GLUCOSE: 93 mg/dL (ref 65–99)
POTASSIUM: 4.3 mmol/L (ref 3.5–5.2)
SODIUM: 140 mmol/L (ref 134–144)
Total Protein: 6.7 g/dL (ref 6.0–8.5)

## 2016-09-09 LAB — CBC WITH DIFFERENTIAL/PLATELET
BASOS ABS: 0 10*3/uL (ref 0.0–0.2)
Basos: 0 %
EOS (ABSOLUTE): 0 10*3/uL (ref 0.0–0.4)
Eos: 1 %
HEMOGLOBIN: 15.1 g/dL (ref 13.0–17.7)
Hematocrit: 44.3 % (ref 37.5–51.0)
Immature Grans (Abs): 0 10*3/uL (ref 0.0–0.1)
Immature Granulocytes: 0 %
LYMPHS ABS: 1.5 10*3/uL (ref 0.7–3.1)
Lymphs: 32 %
MCH: 29.5 pg (ref 26.6–33.0)
MCHC: 34.1 g/dL (ref 31.5–35.7)
MCV: 87 fL (ref 79–97)
MONOCYTES: 10 %
Monocytes Absolute: 0.4 10*3/uL (ref 0.1–0.9)
NEUTROS ABS: 2.6 10*3/uL (ref 1.4–7.0)
Neutrophils: 57 %
Platelets: 195 10*3/uL (ref 150–379)
RBC: 5.12 x10E6/uL (ref 4.14–5.80)
RDW: 13.3 % (ref 12.3–15.4)
WBC: 4.5 10*3/uL (ref 3.4–10.8)

## 2016-09-09 LAB — TSH: TSH: 1.95 u[IU]/mL (ref 0.450–4.500)

## 2016-09-09 LAB — PSA: PROSTATE SPECIFIC AG, SERUM: 0.4 ng/mL (ref 0.0–4.0)

## 2016-09-09 NOTE — Telephone Encounter (Signed)
Gastroenterology Pre-Procedure Review  Request Date: 5/15 Requesting Physician: Dr. Allen Norris  PATIENT REVIEW QUESTIONS: The patient responded to the following health history questions as indicated:    1. Are you having any GI issues? no 2. Do you have a personal history of Polyps? no 3. Do you have a family history of Colon Cancer or Polyps? no 4. Diabetes Mellitus? no 5. Joint replacements in the past 12 months?no 6. Major health problems in the past 3 months?no 7. Any artificial heart valves, MVP, or defibrillator?no    MEDICATIONS & ALLERGIES:    Patient reports the following regarding taking any anticoagulation/antiplatelet therapy:   Plavix, Coumadin, Eliquis, Xarelto, Lovenox, Pradaxa, Brilinta, or Effient? no Aspirin? no  Patient confirms/reports the following medications:  Current Outpatient Prescriptions  Medication Sig Dispense Refill  . escitalopram (LEXAPRO) 10 MG tablet Take 1 tablet (10 mg total) by mouth daily. 30 tablet 12  . lamoTRIgine (LAMICTAL) 100 MG tablet Take 1 tablet (100 mg total) by mouth daily. 30 tablet 12  . omeprazole (PRILOSEC) 20 MG capsule Take 1 capsule (20 mg total) by mouth daily as needed. 30 capsule 12  . triamcinolone (KENALOG) 0.025 % cream daily as needed.    . zolpidem (AMBIEN CR) 12.5 MG CR tablet Take 1 tablet (12.5 mg total) by mouth at bedtime as needed for sleep. 30 tablet 0   No current facility-administered medications for this visit.     Patient confirms/reports the following allergies:  No Known Allergies  No orders of the defined types were placed in this encounter.   AUTHORIZATION INFORMATION Primary Insurance: 1D#: Group #:  Secondary Insurance: 1D#: Group #:  SCHEDULE INFORMATION: Date: 5/15 Time: Location: [facility]

## 2016-09-27 ENCOUNTER — Encounter: Payer: Self-pay | Admitting: *Deleted

## 2016-09-28 ENCOUNTER — Ambulatory Visit: Payer: Managed Care, Other (non HMO) | Admitting: Anesthesiology

## 2016-09-28 ENCOUNTER — Encounter: Payer: Self-pay | Admitting: *Deleted

## 2016-09-28 ENCOUNTER — Ambulatory Visit
Admission: RE | Admit: 2016-09-28 | Discharge: 2016-09-28 | Disposition: A | Discharge status: Home / Self Care | Payer: Managed Care, Other (non HMO) | Source: Ambulatory Visit | Attending: Gastroenterology | Admitting: Gastroenterology

## 2016-09-28 ENCOUNTER — Encounter
Admission: RE | Disposition: A | Discharge status: Home / Self Care | Payer: Self-pay | Source: Ambulatory Visit | Attending: Gastroenterology

## 2016-09-28 DIAGNOSIS — Z79899 Other long term (current) drug therapy: Secondary | ICD-10-CM | POA: Insufficient documentation

## 2016-09-28 DIAGNOSIS — K219 Gastro-esophageal reflux disease without esophagitis: Secondary | ICD-10-CM | POA: Diagnosis not present

## 2016-09-28 DIAGNOSIS — Z1211 Encounter for screening for malignant neoplasm of colon: Secondary | ICD-10-CM | POA: Insufficient documentation

## 2016-09-28 DIAGNOSIS — F329 Major depressive disorder, single episode, unspecified: Secondary | ICD-10-CM | POA: Insufficient documentation

## 2016-09-28 DIAGNOSIS — G4730 Sleep apnea, unspecified: Secondary | ICD-10-CM | POA: Diagnosis not present

## 2016-09-28 DIAGNOSIS — K640 First degree hemorrhoids: Secondary | ICD-10-CM | POA: Diagnosis not present

## 2016-09-28 DIAGNOSIS — K635 Polyp of colon: Secondary | ICD-10-CM

## 2016-09-28 DIAGNOSIS — D125 Benign neoplasm of sigmoid colon: Secondary | ICD-10-CM

## 2016-09-28 DIAGNOSIS — K5730 Diverticulosis of large intestine without perforation or abscess without bleeding: Secondary | ICD-10-CM | POA: Diagnosis not present

## 2016-09-28 HISTORY — PX: COLONOSCOPY WITH PROPOFOL: SHX5780

## 2016-09-28 SURGERY — COLONOSCOPY WITH PROPOFOL
Anesthesia: General

## 2016-09-28 MED ORDER — PROPOFOL 10 MG/ML IV BOLUS
INTRAVENOUS | Status: DC | PRN
  Administered 2016-09-28: 70 mg via INTRAVENOUS

## 2016-09-28 MED ORDER — PROPOFOL 500 MG/50ML IV EMUL
INTRAVENOUS | Status: AC
  Filled 2016-09-28: qty 50

## 2016-09-28 MED ORDER — MIDAZOLAM HCL 2 MG/2ML IJ SOLN
INTRAMUSCULAR | Status: AC
  Filled 2016-09-28: qty 2

## 2016-09-28 MED ORDER — PROPOFOL 500 MG/50ML IV EMUL
INTRAVENOUS | Status: DC | PRN
  Administered 2016-09-28: 150 ug/kg/min via INTRAVENOUS

## 2016-09-28 MED ORDER — LIDOCAINE HCL (CARDIAC) 20 MG/ML IV SOLN
INTRAVENOUS | Status: DC | PRN
  Administered 2016-09-28: 40 mg via INTRAVENOUS

## 2016-09-28 MED ORDER — MIDAZOLAM HCL 2 MG/2ML IJ SOLN
INTRAMUSCULAR | Status: DC | PRN
  Administered 2016-09-28: 1 mg via INTRAVENOUS

## 2016-09-28 MED ORDER — SODIUM CHLORIDE 0.9 % IV SOLN
INTRAVENOUS | Status: DC
  Administered 2016-09-28: 1000 mL via INTRAVENOUS

## 2016-09-28 NOTE — H&P (Signed)
   Scott Chan, Scott Chan [address] [place], [name]  Primary Care Physician:  Scott Chan, Scott Chan Primary Gastroenterologist:  Dr. Allen Norris  Pre-Procedure History & Physical: HPI:  Scott Chan is a 51 y.o. male is here for a screening colonoscopy.   Past Medical History:  Diagnosis Date  . Depression   . GERD (gastroesophageal reflux disease)   . Orbital fracture ([place]) 1986  . Sleep apnea     Past Surgical History:  Procedure Laterality Date  . nephrolithics  2003  . NOSE SURGERY    . TONSILLECTOMY  2007    Prior to Admission medications   Medication Sig Start Date End Date Taking? Authorizing Provider  escitalopram (LEXAPRO) 10 MG tablet Take 1 tablet (10 mg total) by mouth daily. 09/08/16   Scott Chan, Scott Chan  lamoTRIgine (LAMICTAL) 100 MG tablet Take 1 tablet (100 mg total) by mouth daily. 09/08/16   Scott Chan, Scott Chan  omeprazole (PRILOSEC) 20 MG capsule Take 1 capsule (20 mg total) by mouth daily as needed. 09/08/16   Scott Chan, Scott Chan  triamcinolone (KENALOG) 0.025 % cream daily as needed. 08/27/15   Provider, Historical, Scott Chan  zolpidem (AMBIEN CR) 12.5 MG CR tablet Take 1 tablet (12.5 mg total) by mouth at bedtime as needed for sleep. 07/09/15   Scott Chan, Scott Chan    Allergies as of 09/09/2016  . (No Known Allergies)    Family History  Problem Relation Age of Onset  . Hypertension Father   . Ovarian cancer Mother   . Hypertension Brother   . Prostate cancer Brother   . Heart attack Brother     Social History   Social History  . Marital status: Unknown    Spouse name: N/A  . Number of children: N/A  . Years of education: N/A   Occupational History  . Not on file.   Social History Main Topics  . Smoking status: Never Smoker  . Smokeless tobacco: Never Used  . Alcohol use Yes     Comment: occasional  . Drug use: No  . Sexual activity: Not on file   Other Topics Concern  . Not on  file   Social History Narrative  . No narrative on file    Review of Systems: See HPI, otherwise negative ROS  Physical Exam: BP 123/88   Pulse 82   Temp (!) 96.7 F (35.9 C) (Tympanic)   Resp 16   Ht 6\' 1"  (1.854 m)   Wt 212 lb (96.2 kg)   SpO2 100%   BMI 27.97 kg/m  General:   Alert,  pleasant and cooperative in NAD Head:  Normocephalic and atraumatic. Neck:  Supple; no masses or thyromegaly. Lungs:  Clear throughout to auscultation.    Heart:  Regular rate and rhythm. Abdomen:  Soft, nontender and nondistended. Normal bowel sounds, without guarding, and without rebound.   Neurologic:  Alert and  oriented x4;  grossly normal neurologically.  Impression/Plan: Scott Chan is now here to undergo a screening colonoscopy.  Risks, benefits, and alternatives regarding colonoscopy have been reviewed with the patient.  Questions have been answered.  All parties agreeable.

## 2016-09-28 NOTE — Op Note (Signed)
[facility] Gastroenterology Patient Name: Scott Chan Procedure Date: 09/28/2016 11:24 AM MRN: [id number] Account #: [id number] Date of Birth: 04-20-1966 Admit Type: Outpatient Age: 51 Room: [facility] ENDO ROOM 4 Gender: Male Note Status: Finalized Procedure:            Colonoscopy Indications:          Screening for colorectal malignant neoplasm Providers:            Lucilla Lame MD, MD Referring MD:         [name], MD (Referring MD) Medicines:            Propofol per Anesthesia Complications:        No immediate complications. Procedure:            Pre-Anesthesia Assessment:                       - Prior to the procedure, a History and Physical was                        performed, and patient medications and allergies were                        reviewed. The patient's tolerance of previous                        anesthesia was also reviewed. The risks and benefits of                        the procedure and the sedation options and risks were                        discussed with the patient. All questions were                        answered, and informed consent was obtained. Prior                        Anticoagulants: The patient has taken no previous                        anticoagulant or antiplatelet agents. ASA Grade                        Assessment: II - A patient with mild systemic disease.                        After reviewing the risks and benefits, the patient was                        deemed in satisfactory condition to undergo the                        procedure.                       After obtaining informed consent, the colonoscope was                        passed under direct vision. Throughout the procedure,  the patient's blood pressure, pulse, and oxygen                        saturations were monitored continuously. The                        Colonoscope was introduced through the anus and             advanced to the the cecum, identified by appendiceal                        orifice and ileocecal valve. The colonoscopy was                        performed without difficulty. The patient tolerated the                        procedure well. The quality of the bowel preparation                        was excellent. Findings:      The perianal and digital rectal examinations were normal.      Non-bleeding internal hemorrhoids were found during retroflexion. The       hemorrhoids were Grade I (internal hemorrhoids that do not prolapse).      A few small-mouthed diverticula were found in the sigmoid colon. Impression:           - Non-bleeding internal hemorrhoids.                       - Diverticulosis in the sigmoid colon.                       - No specimens collected. Recommendation:       - Discharge patient to home.                       - Resume previous diet.                       - Continue present medications.                       - Repeat colonoscopy in 10 years for screening unless                        any change in family history or lower GI problems. Procedure Code(s):    --- Professional ---                       [phone], Colonoscopy, flexible; diagnostic, including                        collection of specimen(s) by brushing or washing, when                        performed (separate procedure) Diagnosis Code(s):    --- Professional ---                       Z12.11, Encounter for screening for malignant neoplasm  of colon CPT copyright 2016 American Medical Association. All rights reserved. The codes documented in this report are preliminary and upon coder review may  be revised to meet current compliance requirements. Lucilla Lame MD, MD 09/28/2016 11:49:34 AM This report has been signed electronically. Number of Addenda: 0 Note Initiated On: 09/28/2016 11:24 AM Scope Withdrawal Time: 0 hours 6 minutes 43 seconds  Total Procedure Duration:  0 hours 11 minutes 28 seconds       [facility]

## 2016-09-28 NOTE — Anesthesia Procedure Notes (Signed)
Date/Time: 09/28/2016 11:30 AM Performed by: Hedda Slade Pre-anesthesia Checklist: Patient identified, Emergency Drugs available, Suction available and Patient being monitored Patient Re-evaluated:Patient Re-evaluated prior to inductionOxygen Delivery Method: Nasal cannula

## 2016-09-28 NOTE — Anesthesia Post-op Follow-up Note (Cosign Needed)
Anesthesia QCDR form completed.        

## 2016-09-28 NOTE — Anesthesia Preprocedure Evaluation (Signed)
Anesthesia Evaluation  Patient identified by MRN, date of birth, ID band Patient awake    Reviewed: Allergy & Precautions, H&P , NPO status , Patient's Chart, lab work & pertinent test results, reviewed documented beta blocker date and time   Airway Mallampati: II   Neck ROM: full    Dental  (+) Poor Dentition   Pulmonary neg pulmonary ROS, sleep apnea and Continuous Positive Airway Pressure Ventilation ,    Pulmonary exam normal        Cardiovascular negative cardio ROS Normal cardiovascular exam Rhythm:regular Rate:Normal     Neuro/Psych PSYCHIATRIC DISORDERS negative neurological ROS  negative psych ROS   GI/Hepatic negative GI ROS, Neg liver ROS, GERD  Medicated,  Endo/Other  negative endocrine ROS  Renal/GU negative Renal ROS  negative genitourinary   Musculoskeletal   Abdominal   Peds  Hematology negative hematology ROS (+)   Anesthesia Other Findings Past Medical History: No date: Depression No date: GERD (gastroesophageal reflux disease) 1986: Orbital fracture ([place]) No date: Sleep apnea Past Surgical History: 2003: nephrolithics No date: NOSE SURGERY 2007: TONSILLECTOMY   Reproductive/Obstetrics negative OB ROS                             Anesthesia Physical Anesthesia Plan  ASA: III  Anesthesia Plan: General   Post-op Pain Management:    Induction:   Airway Management Planned:   Additional Equipment:   Intra-op Plan:   Post-operative Plan:   Informed Consent: I have reviewed the patients History and Physical, chart, labs and discussed the procedure including the risks, benefits and alternatives for the proposed anesthesia with the patient or authorized representative who has indicated his/her understanding and acceptance.   Dental Advisory Given  Plan Discussed with: CRNA  Anesthesia Plan Comments:         Anesthesia Quick Evaluation

## 2016-09-28 NOTE — Transfer of Care (Signed)
Immediate Anesthesia Transfer of Care Note  Patient: Scott Chan  Procedure(s) Performed: Procedure(s): COLONOSCOPY WITH PROPOFOL (N/A)  Patient Location: PACU  Anesthesia Type:General  Level of Consciousness: awake, alert  and oriented  Airway & Oxygen Therapy: Patient Spontanous Breathing and Patient connected to nasal cannula oxygen  Post-op Assessment: Report given to RN and Post -op Vital signs reviewed and stable  Post vital signs: Reviewed and stable  Last Vitals:  Vitals:   09/28/16 1151 09/28/16 1152  BP: 113/83 113/83  Pulse:  78  Resp:  12  Temp: 36.2 C 36.2 C    Last Pain:  Vitals:   09/28/16 1152  TempSrc: Tympanic  PainSc:          Complications: No apparent anesthesia complications

## 2016-09-29 ENCOUNTER — Encounter: Payer: Self-pay | Admitting: Gastroenterology

## 2016-09-29 NOTE — Anesthesia Postprocedure Evaluation (Signed)
Anesthesia Post Note  Patient: Scott Chan  Procedure(s) Performed: Procedure(s) (LRB): COLONOSCOPY WITH PROPOFOL (N/A)  Patient location during evaluation: PACU Anesthesia Type: General Level of consciousness: awake and alert Pain management: pain level controlled Vital Signs Assessment: post-procedure vital signs reviewed and stable Respiratory status: spontaneous breathing, nonlabored ventilation, respiratory function stable and patient connected to nasal cannula oxygen Cardiovascular status: blood pressure returned to baseline and stable Postop Assessment: no signs of nausea or vomiting Anesthetic complications: no     Last Vitals:  Vitals:   09/28/16 1211 09/28/16 1221  BP: 130/79 134/89  Pulse: 67 73  Resp: 14 16  Temp:      Last Pain:  Vitals:   09/29/16 0741  TempSrc:   PainSc: 0-No pain                 Molli Barrows

## 2016-11-19 ENCOUNTER — Encounter: Payer: Self-pay | Admitting: Cardiology

## 2017-05-03 ENCOUNTER — Encounter: Payer: Self-pay | Admitting: Family Medicine

## 2017-05-03 ENCOUNTER — Ambulatory Visit: Payer: PRIVATE HEALTH INSURANCE | Admitting: Family Medicine

## 2017-05-03 DIAGNOSIS — M130 Polyarthritis, unspecified: Secondary | ICD-10-CM | POA: Diagnosis not present

## 2017-05-03 DIAGNOSIS — F3342 Major depressive disorder, recurrent, in full remission: Secondary | ICD-10-CM | POA: Diagnosis not present

## 2017-05-03 NOTE — Progress Notes (Signed)
BP 116/76 (BP Location: Left Arm, Patient Position: Sitting, Cuff Size: Normal)   Pulse 97   Temp 97.7 F (36.5 C) (Oral)   Wt 226 lb (102.5 kg)   SpO2 93%   BMI 29.82 kg/m    Subjective:    Patient ID: Scott Chan, male    DOB: October 28, 1965, 51 y.o.   MRN: [id number]  HPI: Scott Chan is a 51 y.o. male  Chief Complaint  Patient presents with  . Joint Swelling  . Joint Pain  patient with generalized joint aches discomfort right little finger will get lockedin place but not like trigger finger and be very uncomfortable has various other arthralgias or wrist shoulders elbows knees ankles  In particular left knee lateral proximal area will have it stays bent for more than a few minutes will become very painful and has to straighten it out. Has noticed joint swelling especially in his fingers. Had rheumatoid workup several years ago which was negative.  Relevant past medical, surgical, family and social history reviewed and updated as indicated. Interim medical history since our last visit reviewed. Allergies and medications reviewed and updated.  Review of Systems  Constitutional: Negative.   Respiratory: Negative.   Cardiovascular: Negative.     Per HPI unless specifically indicated above     Objective:    BP 116/76 (BP Location: Left Arm, Patient Position: Sitting, Cuff Size: Normal)   Pulse 97   Temp 97.7 F (36.5 C) (Oral)   Wt 226 lb (102.5 kg)   SpO2 93%   BMI 29.82 kg/m   Wt Readings from Last 3 Encounters:  05/03/17 226 lb (102.5 kg)  09/28/16 212 lb (96.2 kg)  09/08/16 213 lb (96.6 kg)    Physical Exam  Constitutional: He is oriented to person, place, and time. He appears well-developed and well-nourished.  HENT:  Head: Normocephalic and atraumatic.  Eyes: Conjunctivae and EOM are normal.  Neck: Normal range of motion.  Cardiovascular: Normal rate, regular rhythm and normal heart sounds.  Pulmonary/Chest: Effort normal and breath sounds  normal.  Musculoskeletal: Normal range of motion.  Joints some nonspecific changes of knuckles and hands.. Otherwise clear.  Neurological: He is alert and oriented to person, place, and time.  Skin: No erythema.  Psychiatric: He has a normal mood and affect. His behavior is normal. Judgment and thought content normal.    Results for orders placed or performed in visit on 09/08/16  Microscopic Examination  Result Value Ref Range   WBC, UA None seen 0 - 5 /hpf   RBC, UA None seen 0 - 2 /hpf   Epithelial Cells (non renal) CANCELED    Bacteria, UA None seen None seen/Few  CBC with Differential/Platelet  Result Value Ref Range   WBC 4.5 3.4 - 10.8 x10E3/uL   RBC 5.12 4.14 - 5.80 x10E6/uL   Hemoglobin 15.1 13.0 - 17.7 g/dL   Hematocrit 44.3 37.5 - 51.0 %   MCV 87 79 - 97 fL   MCH 29.5 26.6 - 33.0 pg   MCHC 34.1 31.5 - 35.7 g/dL   RDW 13.3 12.3 - 15.4 %   Platelets 195 150 - 379 x10E3/uL   Neutrophils 57 Not Estab. %   Lymphs 32 Not Estab. %   Monocytes 10 Not Estab. %   Eos 1 Not Estab. %   Basos 0 Not Estab. %   Neutrophils Absolute 2.6 1.4 - 7.0 x10E3/uL   Lymphocytes Absolute 1.5 0.7 - 3.1 x10E3/uL   Monocytes  Absolute 0.4 0.1 - 0.9 x10E3/uL   EOS (ABSOLUTE) 0.0 0.0 - 0.4 x10E3/uL   Basophils Absolute 0.0 0.0 - 0.2 x10E3/uL   Immature Granulocytes 0 Not Estab. %   Immature Grans (Abs) 0.0 0.0 - 0.1 x10E3/uL  Comprehensive metabolic panel  Result Value Ref Range   Glucose 93 65 - 99 mg/dL   BUN 18 6 - 24 mg/dL   Creatinine, Ser 0.97 0.76 - 1.27 mg/dL   GFR calc non Af Amer 91 >59 mL/min/1.73   GFR calc Af Amer 105 >59 mL/min/1.73   BUN/Creatinine Ratio 19 9 - 20   Sodium 140 134 - 144 mmol/L   Potassium 4.3 3.5 - 5.2 mmol/L   Chloride 101 96 - 106 mmol/L   CO2 24 18 - 29 mmol/L   Calcium 9.5 8.7 - 10.2 mg/dL   Total Protein 6.7 6.0 - 8.5 g/dL   Albumin 4.5 3.5 - 5.5 g/dL   Globulin, Total 2.2 1.5 - 4.5 g/dL   Albumin/Globulin Ratio 2.0 1.2 - 2.2   Bilirubin Total  0.5 0.0 - 1.2 mg/dL   Alkaline Phosphatase 90 39 - 117 IU/L   AST 22 0 - 40 IU/L   ALT 20 0 - 44 IU/L  PSA  Result Value Ref Range   Prostate Specific Ag, Serum 0.4 0.0 - 4.0 ng/mL  TSH  Result Value Ref Range   TSH 1.950 0.450 - 4.500 uIU/mL  Urinalysis, Routine w reflex microscopic  Result Value Ref Range   Specific Gravity, UA 1.020 1.005 - 1.030   pH, UA 6.0 5.0 - 7.5   Color, UA Yellow Yellow   Appearance Ur Clear Clear   Leukocytes, UA Negative Negative   Protein, UA Negative Negative/Trace   Glucose, UA Negative Negative   Ketones, UA Negative Negative   RBC, UA Negative Negative   Bilirubin, UA Negative Negative   Urobilinogen, Ur 0.2 0.2 - 1.0 mg/dL   Nitrite, UA Negative Negative   Microscopic Examination See below:       Assessment & Plan:   Problem List Items Addressed This Visit      Musculoskeletal and Integument   Arthritis of multiple sites    For arthritis multiple sites patient will take meloxicam which he already has. We'll check lab work checking for  Inflammatory changes and refer to rheumatology.      Relevant Orders   Comprehensive metabolic panel   CBC with Differential/Platelet   TSH   Sed Rate (ESR)     Other   Depression    The current medical regimen is effective;  continue present plan and medications.           Follow up plan: Return in about 4 months (around 09/01/2017) for Physical Exam.

## 2017-05-03 NOTE — Assessment & Plan Note (Signed)
The current medical regimen is effective;  continue present plan and medications.  

## 2017-05-03 NOTE — Assessment & Plan Note (Signed)
For arthritis multiple sites patient will take meloxicam which he already has. We'll check lab work checking for  Inflammatory changes and refer to rheumatology.

## 2017-05-03 NOTE — Addendum Note (Signed)
Addended by: Golden Pop A on: 05/03/2017 04:52 PM   Modules accepted: Orders

## 2017-05-04 ENCOUNTER — Encounter: Payer: Self-pay | Admitting: Family Medicine

## 2017-05-04 LAB — CBC WITH DIFFERENTIAL/PLATELET
Basophils Absolute: 0 10*3/uL (ref 0.0–0.2)
Basos: 0 %
EOS (ABSOLUTE): 0 10*3/uL (ref 0.0–0.4)
Eos: 1 %
Hematocrit: 41.9 % (ref 37.5–51.0)
Hemoglobin: 14.2 g/dL (ref 13.0–17.7)
IMMATURE GRANS (ABS): 0 10*3/uL (ref 0.0–0.1)
Immature Granulocytes: 0 %
LYMPHS: 28 %
Lymphocytes Absolute: 1.8 10*3/uL (ref 0.7–3.1)
MCH: 28.4 pg (ref 26.6–33.0)
MCHC: 33.9 g/dL (ref 31.5–35.7)
MCV: 84 fL (ref 79–97)
Monocytes Absolute: 0.5 10*3/uL (ref 0.1–0.9)
Monocytes: 7 %
NEUTROS ABS: 4.3 10*3/uL (ref 1.4–7.0)
NEUTROS PCT: 64 %
Platelets: 225 10*3/uL (ref 150–379)
RBC: 5 x10E6/uL (ref 4.14–5.80)
RDW: 14 % (ref 12.3–15.4)
WBC: 6.6 10*3/uL (ref 3.4–10.8)

## 2017-05-04 LAB — COMPREHENSIVE METABOLIC PANEL
A/G RATIO: 2 (ref 1.2–2.2)
ALK PHOS: 96 IU/L (ref 39–117)
ALT: 28 IU/L (ref 0–44)
AST: 24 IU/L (ref 0–40)
Albumin: 4.6 g/dL (ref 3.5–5.5)
BILIRUBIN TOTAL: 0.3 mg/dL (ref 0.0–1.2)
BUN/Creatinine Ratio: 18 (ref 9–20)
BUN: 19 mg/dL (ref 6–24)
CHLORIDE: 107 mmol/L — AB (ref 96–106)
CO2: 21 mmol/L (ref 20–29)
Calcium: 9.4 mg/dL (ref 8.7–10.2)
Creatinine, Ser: 1.06 mg/dL (ref 0.76–1.27)
GFR calc non Af Amer: 81 mL/min/{1.73_m2} (ref >59)
GFR, EST AFRICAN AMERICAN: 94 mL/min/{1.73_m2} (ref >59)
Globulin, Total: 2.3 g/dL (ref 1.5–4.5)
Glucose: 104 mg/dL — ABNORMAL HIGH (ref 65–99)
POTASSIUM: 4.5 mmol/L (ref 3.5–5.2)
Sodium: 143 mmol/L (ref 134–144)
Total Protein: 6.9 g/dL (ref 6.0–8.5)

## 2017-05-04 LAB — SEDIMENTATION RATE: Sed Rate: 5 mm/hr (ref 0–30)

## 2017-05-04 LAB — TSH: TSH: 2.1 u[IU]/mL (ref 0.450–4.500)

## 2017-06-02 ENCOUNTER — Other Ambulatory Visit: Payer: Self-pay | Admitting: Family Medicine

## 2017-06-02 DIAGNOSIS — M2550 Pain in unspecified joint: Secondary | ICD-10-CM | POA: Insufficient documentation

## 2017-06-02 DIAGNOSIS — G8929 Other chronic pain: Secondary | ICD-10-CM | POA: Insufficient documentation

## 2017-06-02 MED ORDER — ZOLPIDEM TARTRATE 10 MG PO TABS: 10.0000 mg | ORAL_TABLET | Freq: Every evening | ORAL | 0 refills | Status: DC | PRN

## 2017-06-02 MED ORDER — OMEPRAZOLE 20 MG PO CPDR: 20.0000 mg | DELAYED_RELEASE_CAPSULE | Freq: Every day | ORAL | 3 refills | Status: DC | PRN

## 2017-06-08 ENCOUNTER — Telehealth: Payer: Self-pay

## 2017-06-08 NOTE — Telephone Encounter (Signed)
S/w patient. He would like to schedule his stress test now. Patient has not been seen since February 2018 by Dr Yvone Neu. Advised patient that he would need to establish with a new provider before scheduling test. He was agreeable. Patient scheduled to see Dr Rockey Situ on 07/01/17.

## 2017-06-08 NOTE — Telephone Encounter (Signed)
Patient was to have a test scheduled a while back but his insurance did not cover He has since changed insurances and would like to look into getting the test rescheduled Please call to discuss

## 2017-07-01 ENCOUNTER — Ambulatory Visit: Payer: Managed Care, Other (non HMO) | Admitting: Cardiovascular Disease

## 2017-07-04 NOTE — Progress Notes (Addendum)
Cardiology Office Note  Date:  07/06/2017   ID:  Scott Chan, DOB Jan 02, 1966, MRN [id number]  PCP:  [name], MD   Chief Complaint  Patient presents with  . Other    Former Water engineer patient. Patient c/o chest discomfort and SOB. Meds reviewed verbally with patient.     HPI:  Scott Chan is a 52 y.o. male  Episodes of chest pain.  Near syncope SOB OSA, unable to afford CPAP athritis depression Fm hx of CAD Who presents today for follow-up of chest pain and shortness of breath  Stress test and echo previously ordered 07/08/2016, did not complete  Recently played 1 hour basketball, had some mild shortness of breath running, no significant chest pain  Other times will be doing light activities with very light soreness in the chest, nothing severe Overall feels he is doing well  Reports that he does have CPAP Wears it often  He reports being diagnosed with likely severe sleep apnea but is unable to afford the CPAP.  Patient denies smoking.  Patient reports history of possible MI in brother in his 44s.  Stents in his father in his 63s.   Total chol 163, LDL 106 No recent lab work available  EKG personally reviewed by myself on todays visit Shows normal sinus rhythm rate 76 bpm no significant ST or T wave changes   PMH:   has a past medical history of Depression, GERD (gastroesophageal reflux disease), Orbital fracture ([place]) (1986), and Sleep apnea.  PSH:    Past Surgical History:  Procedure Laterality Date  . COLONOSCOPY WITH PROPOFOL N/A 09/28/2016   Procedure: COLONOSCOPY WITH PROPOFOL;  Surgeon: Lucilla Lame, MD;  Location: [facility] ENDOSCOPY;  Service: Endoscopy;  Laterality: N/A;  . nephrolithics  2003  . NOSE SURGERY    . TONSILLECTOMY  2007    Current Outpatient Medications  Medication Sig Dispense Refill  . ARIPiprazole (ABILIFY) 5 MG tablet Take 5 mg by mouth daily.    Marland Kitchen escitalopram (LEXAPRO) 10 MG tablet Take 1 tablet (10 mg total) by  mouth daily. 30 tablet 12  . lamoTRIgine (LAMICTAL) 100 MG tablet Take 1 tablet (100 mg total) by mouth daily. 30 tablet 12  . omeprazole (PRILOSEC) 20 MG capsule Take 1 capsule (20 mg total) by mouth daily as needed. 90 capsule 3  . triamcinolone (KENALOG) 0.025 % cream daily as needed.    . zolpidem (AMBIEN) 10 MG tablet Take 1 tablet (10 mg total) by mouth at bedtime as needed for sleep. 30 tablet 0   No current facility-administered medications for this visit.      Allergies:   Patient has no known allergies.   Social History:  The patient  reports that  has never smoked. he has never used smokeless tobacco. He reports that he drinks alcohol. He reports that he does not use drugs.   Family History:   family history includes Heart attack in his brother; Hypertension in his brother and father; Ovarian cancer in his mother; Prostate cancer in his brother.    Review of Systems: Review of Systems  Constitutional: Negative.   Respiratory: Positive for shortness of breath.   Cardiovascular: Positive for chest pain.  Gastrointestinal: Negative.   Musculoskeletal: Negative.   Neurological: Negative.   Psychiatric/Behavioral: Negative.   All other systems reviewed and are negative.    PHYSICAL EXAM: VS:  BP 110/66 (BP Location: Left Arm, Patient Position: Sitting, Cuff Size: Normal)   Pulse 76   Ht  6\' 1"  (1.854 m)   Wt 215 lb (97.5 kg)   BMI 28.37 kg/m  , BMI Body mass index is 28.37 kg/m. Constitutional:  oriented to person, place, and time. No distress.  HENT:  Head: Normocephalic and atraumatic.  Eyes:  no discharge. No scleral icterus.  Neck: Normal range of motion. Neck supple. No JVD present.  Cardiovascular: Normal rate, regular rhythm, normal heart sounds and intact distal pulses. Exam reveals no gallop and no friction rub. No edema No murmur heard. Pulmonary/Chest: Effort normal and breath sounds normal. No stridor. No respiratory distress.  no wheezes.  no rales.  no  tenderness.  Abdominal: Soft.  no distension.  no tenderness.  Musculoskeletal: Normal range of motion.  no  tenderness or deformity.  Neurological:  normal muscle tone. Coordination normal. No atrophy Skin: Skin is warm and dry. No rash noted. not diaphoretic.  Psychiatric:  normal mood and affect. behavior is normal. Thought content normal.     Recent Labs: 05/03/2017: ALT 28; BUN 19; Creatinine, Ser 1.06; Hemoglobin 14.2; Platelets 225; Potassium 4.5; Sodium 143; TSH 2.100    Lipid Panel Lab Results  Component Value Date   CHOL 163 09/08/2015   HDL 40 09/08/2015   LDLCALC 106 (H) 09/08/2015   TRIG 84 09/08/2015      Wt Readings from Last 3 Encounters:  07/06/17 215 lb (97.5 kg)  05/03/17 226 lb (102.5 kg)  09/28/16 212 lb (96.2 kg)       ASSESSMENT AND PLAN:  Chest pain with moderate risk for cardiac etiology - Plan: EKG 12-Lead Atypical chest pain, no risk factors for coronary disease apart from family history Cholesterol is reasonable, non-smoker, nondiabetic Recently played basketball for 1 hour with minimal if any symptoms Discussed screening studies with him such as CT coronary calcium scoring, various types of stress testing He will call us if symptoms progress  SOB (shortness of breath) - Plan: EKG 12-Lead Likely secondary to conditioning, minimal symptoms even playing basketball for 1 hour  OSA (obstructive sleep apnea)  Wears CPAP   gastroesophageal reflux disease, esophagitis presence not specified On PPI  Disposition:   F/U 12 months as needed   Total encounter time more than 25 minutes  Greater than 50% was spent in counseling and coordination of care with the patient    Orders Placed This Encounter  Procedures  . EKG 12-Lead     Signed, Esmond Plants, M.D., Ph.D. 07/06/2017  [place], [place]

## 2017-07-06 ENCOUNTER — Encounter: Payer: Self-pay | Admitting: Cardiovascular Disease

## 2017-07-06 ENCOUNTER — Ambulatory Visit (INDEPENDENT_AMBULATORY_CARE_PROVIDER_SITE_OTHER): Payer: PRIVATE HEALTH INSURANCE | Admitting: Cardiovascular Disease

## 2017-07-06 VITALS — BP 110/66 | HR 76 | Ht 73.0 in | Wt 215.0 lb

## 2017-07-06 DIAGNOSIS — R0602 Shortness of breath: Secondary | ICD-10-CM

## 2017-07-06 DIAGNOSIS — K219 Gastro-esophageal reflux disease without esophagitis: Secondary | ICD-10-CM

## 2017-07-06 DIAGNOSIS — G4733 Obstructive sleep apnea (adult) (pediatric): Secondary | ICD-10-CM

## 2017-07-06 DIAGNOSIS — R079 Chest pain, unspecified: Secondary | ICD-10-CM

## 2017-07-06 NOTE — Patient Instructions (Addendum)
Medication Instructions:   No medication changes made  Labwork:  No new labs needed  Testing/Procedures:  No further testing at this time  Research the CT coronary calcium score $150   Follow-Up: It was a pleasure seeing you in the office today. Please call us if you have new issues that need to be addressed before your next appt.  [phone]  Your physician wants you to follow-up in: 12 months as needed.  You will receive a reminder letter in the mail two months in advance. If you don't receive a letter, please call our office to schedule the follow-up appointment.  If you need a refill on your cardiac medications before your next appointment, please call your pharmacy.  For educational health videos Log in to : [email or web address] Or : [email or web address], password : [email or web address]

## 2017-07-06 NOTE — Addendum Note (Signed)
Addended by: Valora Corporal on: 07/06/2017 11:13 AM   Modules accepted: Orders

## 2017-07-08 ENCOUNTER — Ambulatory Visit: Payer: Managed Care, Other (non HMO) | Admitting: Cardiovascular Disease

## 2017-09-13 ENCOUNTER — Ambulatory Visit (INDEPENDENT_AMBULATORY_CARE_PROVIDER_SITE_OTHER): Payer: PRIVATE HEALTH INSURANCE | Admitting: Family Medicine

## 2017-09-13 ENCOUNTER — Encounter: Payer: Self-pay | Admitting: Family Medicine

## 2017-09-13 VITALS — BP 105/74 | HR 90 | Ht 73.0 in | Wt 212.0 lb

## 2017-09-13 DIAGNOSIS — E291 Testicular hypofunction: Secondary | ICD-10-CM | POA: Diagnosis not present

## 2017-09-13 DIAGNOSIS — Z125 Encounter for screening for malignant neoplasm of prostate: Secondary | ICD-10-CM

## 2017-09-13 DIAGNOSIS — F3342 Major depressive disorder, recurrent, in full remission: Secondary | ICD-10-CM

## 2017-09-13 DIAGNOSIS — Z0000 Encounter for general adult medical examination without abnormal findings: Secondary | ICD-10-CM | POA: Diagnosis not present

## 2017-09-13 DIAGNOSIS — G4730 Sleep apnea, unspecified: Secondary | ICD-10-CM | POA: Diagnosis not present

## 2017-09-13 DIAGNOSIS — Z1329 Encounter for screening for other suspected endocrine disorder: Secondary | ICD-10-CM

## 2017-09-13 LAB — URINALYSIS, ROUTINE W REFLEX MICROSCOPIC
BILIRUBIN UA: NEGATIVE
GLUCOSE, UA: NEGATIVE
Ketones, UA: NEGATIVE
Leukocytes, UA: NEGATIVE
Nitrite, UA: NEGATIVE
PH UA: 5 (ref 5.0–7.5)
PROTEIN UA: NEGATIVE
RBC, UA: NEGATIVE
Specific Gravity, UA: 1.025 (ref 1.005–1.030)
UUROB: 0.2 mg/dL (ref 0.2–1.0)

## 2017-09-13 MED ORDER — LAMOTRIGINE 100 MG PO TABS: 100.0000 mg | ORAL_TABLET | Freq: Every day | ORAL | 12 refills | Status: DC

## 2017-09-13 MED ORDER — DICLOFENAC SODIUM 1 % TD GEL: 2.0000 g | Freq: Four times a day (QID) | TRANSDERMAL | 3 refills | Status: DC

## 2017-09-13 MED ORDER — OMEPRAZOLE 20 MG PO CPDR: 20.0000 mg | DELAYED_RELEASE_CAPSULE | Freq: Every day | ORAL | 3 refills | Status: DC | PRN

## 2017-09-13 MED ORDER — ESCITALOPRAM OXALATE 10 MG PO TABS: 10.0000 mg | ORAL_TABLET | Freq: Every day | ORAL | 12 refills | Status: DC

## 2017-09-13 NOTE — Progress Notes (Signed)
BP 105/74   Pulse 90   Ht '6\' 1"'$  (1.854 m)   Wt 212 lb (96.2 kg)   SpO2 100%   BMI 27.97 kg/m    Subjective:    Patient ID: Scott Chan, male    DOB: 1966/01/28, 52 y.o.   MRN: [id number]  HPI: Scott Chan is a 52 y.o. male  Chief Complaint  Patient presents with  . Annual Exam  Patient all in all doing well no complaints has reflux issues controlled well with Prilosec. Discussed options for care when needs extra. Nerves doing well on Lamictal and Lexapro no issues taking only 100 mg of Lamictal. Rarely to not using Ambien at all.  Relevant past medical, surgical, family and social history reviewed and updated as indicated. Interim medical history since our last visit reviewed. Allergies and medications reviewed and updated.  Review of Systems  Constitutional: Negative.   HENT: Negative.   Eyes: Negative.   Respiratory: Negative.   Cardiovascular: Negative.   Gastrointestinal: Negative.   Endocrine: Negative.   Genitourinary: Negative.   Musculoskeletal: Negative.   Skin: Negative.   Allergic/Immunologic: Negative.   Neurological: Negative.   Hematological: Negative.   Psychiatric/Behavioral: Negative.     Per HPI unless specifically indicated above     Objective:    BP 105/74   Pulse 90   Ht '6\' 1"'$  (1.854 m)   Wt 212 lb (96.2 kg)   SpO2 100%   BMI 27.97 kg/m   Wt Readings from Last 3 Encounters:  09/13/17 212 lb (96.2 kg)  07/06/17 215 lb (97.5 kg)  05/03/17 226 lb (102.5 kg)    Physical Exam  Constitutional: He is oriented to person, place, and time. He appears well-developed and well-nourished.  HENT:  Head: Normocephalic and atraumatic.  Right Ear: External ear normal.  Left Ear: External ear normal.  Eyes: Pupils are equal, round, and reactive to light. Conjunctivae and EOM are normal.  Neck: Normal range of motion. Neck supple.  Cardiovascular: Normal rate, regular rhythm, normal heart sounds and intact distal pulses.    Pulmonary/Chest: Effort normal and breath sounds normal.  Abdominal: Soft. Bowel sounds are normal. There is no splenomegaly or hepatomegaly.  Genitourinary:  Genitourinary Comments: Done at urology  Musculoskeletal: Normal range of motion.  Neurological: He is alert and oriented to person, place, and time. He has normal reflexes.  Skin: No rash noted. No erythema.  Psychiatric: He has a normal mood and affect. His behavior is normal. Judgment and thought content normal.    Results for orders placed or performed in visit on 05/03/17  Comprehensive metabolic panel  Result Value Ref Range   Glucose 104 (H) 65 - 99 mg/dL   BUN 19 6 - 24 mg/dL   Creatinine, Ser 1.06 0.76 - 1.27 mg/dL   GFR calc non Af Amer 81 >59 mL/min/1.73   GFR calc Af Amer 94 >59 mL/min/1.73   BUN/Creatinine Ratio 18 9 - 20   Sodium 143 134 - 144 mmol/L   Potassium 4.5 3.5 - 5.2 mmol/L   Chloride 107 (H) 96 - 106 mmol/L   CO2 21 20 - 29 mmol/L   Calcium 9.4 8.7 - 10.2 mg/dL   Total Protein 6.9 6.0 - 8.5 g/dL   Albumin 4.6 3.5 - 5.5 g/dL   Globulin, Total 2.3 1.5 - 4.5 g/dL   Albumin/Globulin Ratio 2.0 1.2 - 2.2   Bilirubin Total 0.3 0.0 - 1.2 mg/dL   Alkaline Phosphatase 96 39 - 117  IU/L   AST 24 0 - 40 IU/L   ALT 28 0 - 44 IU/L  CBC with Differential/Platelet  Result Value Ref Range   WBC 6.6 3.4 - 10.8 x10E3/uL   RBC 5.00 4.14 - 5.80 x10E6/uL   Hemoglobin 14.2 13.0 - 17.7 g/dL   Hematocrit 41.9 37.5 - 51.0 %   MCV 84 79 - 97 fL   MCH 28.4 26.6 - 33.0 pg   MCHC 33.9 31.5 - 35.7 g/dL   RDW 14.0 12.3 - 15.4 %   Platelets 225 150 - 379 x10E3/uL   Neutrophils 64 Not Estab. %   Lymphs 28 Not Estab. %   Monocytes 7 Not Estab. %   Eos 1 Not Estab. %   Basos 0 Not Estab. %   Neutrophils Absolute 4.3 1.4 - 7.0 x10E3/uL   Lymphocytes Absolute 1.8 0.7 - 3.1 x10E3/uL   Monocytes Absolute 0.5 0.1 - 0.9 x10E3/uL   EOS (ABSOLUTE) 0.0 0.0 - 0.4 x10E3/uL   Basophils Absolute 0.0 0.0 - 0.2 x10E3/uL   Immature  Granulocytes 0 Not Estab. %   Immature Grans (Abs) 0.0 0.0 - 0.1 x10E3/uL   Hematology Comments: Note:   TSH  Result Value Ref Range   TSH 2.100 0.450 - 4.500 uIU/mL  Sed Rate (ESR)  Result Value Ref Range   Sed Rate 5 0 - 30 mm/hr      Assessment & Plan:   Problem List Items Addressed This Visit      Respiratory   Sleep apnea     Endocrine   Hypogonadism in male    Back on testosterone        Other   Depression    The current medical regimen is effective;  continue present plan and medications.       Relevant Medications   escitalopram (LEXAPRO) 10 MG tablet    Other Visit Diagnoses    PE (physical exam), annual    -  Primary   Relevant Orders   CBC with Differential/Platelet   Comprehensive metabolic panel   Lipid panel   TSH   Urinalysis, Routine w reflex microscopic   Testosterone   PSA   Prostate cancer screening       Thyroid disorder screen       Relevant Orders   TSH    Patient with left patellar quadriceps insertion pain will give diclofenac gel  Follow up plan: Return in about 6 months (around 03/15/2018).

## 2017-09-13 NOTE — Assessment & Plan Note (Signed)
The current medical regimen is effective;  continue present plan and medications.  

## 2017-09-13 NOTE — Assessment & Plan Note (Signed)
Back on testosterone

## 2017-09-14 ENCOUNTER — Encounter: Payer: Self-pay | Admitting: Family Medicine

## 2017-09-14 LAB — CBC WITH DIFFERENTIAL/PLATELET
BASOS: 0 %
Basophils Absolute: 0 10*3/uL (ref 0.0–0.2)
EOS (ABSOLUTE): 0 10*3/uL (ref 0.0–0.4)
EOS: 0 %
HEMATOCRIT: 42.9 % (ref 37.5–51.0)
Hemoglobin: 14.2 g/dL (ref 13.0–17.7)
IMMATURE GRANS (ABS): 0 10*3/uL (ref 0.0–0.1)
IMMATURE GRANULOCYTES: 0 %
LYMPHS: 27 %
Lymphocytes Absolute: 1.8 10*3/uL (ref 0.7–3.1)
MCH: 28.3 pg (ref 26.6–33.0)
MCHC: 33.1 g/dL (ref 31.5–35.7)
MCV: 86 fL (ref 79–97)
Monocytes Absolute: 0.5 10*3/uL (ref 0.1–0.9)
Monocytes: 8 %
NEUTROS PCT: 65 %
Neutrophils Absolute: 4.4 10*3/uL (ref 1.4–7.0)
Platelets: 234 10*3/uL (ref 150–379)
RBC: 5.01 x10E6/uL (ref 4.14–5.80)
RDW: 13.9 % (ref 12.3–15.4)
WBC: 6.7 10*3/uL (ref 3.4–10.8)

## 2017-09-14 LAB — COMPREHENSIVE METABOLIC PANEL
A/G RATIO: 2 (ref 1.2–2.2)
ALBUMIN: 4.6 g/dL (ref 3.5–5.5)
ALT: 26 IU/L (ref 0–44)
AST: 26 IU/L (ref 0–40)
Alkaline Phosphatase: 87 IU/L (ref 39–117)
BUN/Creatinine Ratio: 21 — ABNORMAL HIGH (ref 9–20)
BUN: 20 mg/dL (ref 6–24)
Bilirubin Total: 0.5 mg/dL (ref 0.0–1.2)
CALCIUM: 9.4 mg/dL (ref 8.7–10.2)
CO2: 22 mmol/L (ref 20–29)
Chloride: 104 mmol/L (ref 96–106)
Creatinine, Ser: 0.94 mg/dL (ref 0.76–1.27)
GFR, EST AFRICAN AMERICAN: 108 mL/min/{1.73_m2} (ref >59)
GFR, EST NON AFRICAN AMERICAN: 93 mL/min/{1.73_m2} (ref >59)
GLOBULIN, TOTAL: 2.3 g/dL (ref 1.5–4.5)
Glucose: 72 mg/dL (ref 65–99)
POTASSIUM: 3.9 mmol/L (ref 3.5–5.2)
SODIUM: 142 mmol/L (ref 134–144)
TOTAL PROTEIN: 6.9 g/dL (ref 6.0–8.5)

## 2017-09-14 LAB — PSA: Prostate Specific Ag, Serum: 0.5 ng/mL (ref 0.0–4.0)

## 2017-09-14 LAB — LIPID PANEL
Chol/HDL Ratio: 4.4 ratio (ref 0.0–5.0)
Cholesterol, Total: 164 mg/dL (ref 100–199)
HDL: 37 mg/dL — AB (ref >39)
LDL Calculated: 80 mg/dL (ref 0–99)
Triglycerides: 234 mg/dL — ABNORMAL HIGH (ref 0–149)
VLDL Cholesterol Cal: 47 mg/dL — ABNORMAL HIGH (ref 5–40)

## 2017-09-14 LAB — TSH: TSH: 2.04 u[IU]/mL (ref 0.450–4.500)

## 2017-09-14 LAB — TESTOSTERONE: Testosterone: 617 ng/dL (ref 264–916)

## 2018-01-24 ENCOUNTER — Ambulatory Visit (INDEPENDENT_AMBULATORY_CARE_PROVIDER_SITE_OTHER)
Admission: RE | Admit: 2018-01-24 | Discharge: 2018-01-24 | Disposition: A | Discharge status: Home / Self Care | Payer: Self-pay | Source: Ambulatory Visit | Attending: Cardiovascular Disease | Admitting: Cardiovascular Disease

## 2018-01-24 DIAGNOSIS — R079 Chest pain, unspecified: Secondary | ICD-10-CM

## 2018-01-24 DIAGNOSIS — R0602 Shortness of breath: Secondary | ICD-10-CM

## 2018-01-30 ENCOUNTER — Telehealth: Payer: Self-pay | Admitting: Cardiovascular Disease

## 2018-01-30 NOTE — Telephone Encounter (Signed)
Patient calling to discuss recent ct calcium score  testing results   Please call

## 2018-01-31 NOTE — Telephone Encounter (Signed)
Spoke with patient and reviewed test results and recommendations. He verbalized understanding with no further questions at this time.

## 2018-07-03 ENCOUNTER — Encounter: Payer: Self-pay | Admitting: Family Medicine

## 2018-07-03 NOTE — Telephone Encounter (Signed)
Due to symptoms verbally spoke to Mrs. Scott Desanctis, PA-C who recommened ASAP appointment and E.R visit if currently symptomatic and/or symptoms worsen. Called and spoke to patient, and relayed recommendations. Patient scheduled for tomorrow at 9 a.m.

## 2018-07-04 ENCOUNTER — Ambulatory Visit: Payer: PRIVATE HEALTH INSURANCE | Admitting: Family Medicine

## 2018-07-04 ENCOUNTER — Encounter: Payer: Self-pay | Admitting: Family Medicine

## 2018-07-04 VITALS — BP 118/74 | HR 89 | Temp 98.1°F | Ht 73.0 in | Wt 230.0 lb

## 2018-07-04 DIAGNOSIS — R0602 Shortness of breath: Secondary | ICD-10-CM | POA: Diagnosis not present

## 2018-07-04 NOTE — Progress Notes (Signed)
BP 118/74 (BP Location: Right Arm, Patient Position: Sitting, Cuff Size: Normal)   Pulse 89   Temp 98.1 F (36.7 C) (Oral)   Ht 6\' 1"  (1.854 m)   Wt 230 lb (104.3 kg)   SpO2 98%   BMI 30.34 kg/m    Subjective:    Patient ID: Scott Chan, male    DOB: June 30, 1965, 53 y.o.   MRN: [id number]  HPI: Scott Chan is a 53 y.o. male  Chief Complaint  Patient presents with  . Dizziness    Patient will get SOB and dizzy exercising and walking long distances.    Pt here today for worsening SOB and dizziness with exertion the past few months. Was evaluated 1 year ago for this issue by Cardiology and a stress test was recommended. Did not complete testing at that time. Had CT calcium score 6 months ago which came back at 9. States currently he becomes faint after climbing the staircase to his office most days even. No CP, diaphoresis. Non smoker, no hx of pulmonary dz or DM but does have uncontrolled sleep apnea  Relevant past medical, surgical, family and social history reviewed and updated as indicated. Interim medical history since our last visit reviewed. Allergies and medications reviewed and updated.  Review of Systems  Per HPI unless specifically indicated above     Objective:    BP 118/74 (BP Location: Right Arm, Patient Position: Sitting, Cuff Size: Normal)   Pulse 89   Temp 98.1 F (36.7 C) (Oral)   Ht 6\' 1"  (1.854 m)   Wt 230 lb (104.3 kg)   SpO2 98%   BMI 30.34 kg/m   Wt Readings from Last 3 Encounters:  07/04/18 230 lb (104.3 kg)  09/13/17 212 lb (96.2 kg)  07/06/17 215 lb (97.5 kg)    Physical Exam Vitals signs and nursing note reviewed.  Constitutional:      Appearance: Normal appearance.  HENT:     Head: Atraumatic.  Eyes:     Extraocular Movements: Extraocular movements intact.     Conjunctiva/sclera: Conjunctivae normal.  Neck:     Musculoskeletal: Normal range of motion and neck supple.  Cardiovascular:     Rate and Rhythm: Normal rate  and regular rhythm.  Pulmonary:     Effort: Pulmonary effort is normal.     Breath sounds: Normal breath sounds.  Musculoskeletal: Normal range of motion.  Skin:    General: Skin is warm and dry.  Neurological:     General: No focal deficit present.     Mental Status: He is oriented to person, place, and time.  Psychiatric:        Mood and Affect: Mood normal.        Thought Content: Thought content normal.        Judgment: Judgment normal.    EKG today showing sinus rhythm at 82 bpm, without acute ST or T wave abnormalities  Results for orders placed or performed in visit on 09/13/17  CBC with Differential/Platelet  Result Value Ref Range   WBC 6.7 3.4 - 10.8 x10E3/uL   RBC 5.01 4.14 - 5.80 x10E6/uL   Hemoglobin 14.2 13.0 - 17.7 g/dL   Hematocrit 42.9 37.5 - 51.0 %   MCV 86 79 - 97 fL   MCH 28.3 26.6 - 33.0 pg   MCHC 33.1 31.5 - 35.7 g/dL   RDW 13.9 12.3 - 15.4 %   Platelets 234 150 - 379 x10E3/uL   Neutrophils 65 Not  Estab. %   Lymphs 27 Not Estab. %   Monocytes 8 Not Estab. %   Eos 0 Not Estab. %   Basos 0 Not Estab. %   Neutrophils Absolute 4.4 1.4 - 7.0 x10E3/uL   Lymphocytes Absolute 1.8 0.7 - 3.1 x10E3/uL   Monocytes Absolute 0.5 0.1 - 0.9 x10E3/uL   EOS (ABSOLUTE) 0.0 0.0 - 0.4 x10E3/uL   Basophils Absolute 0.0 0.0 - 0.2 x10E3/uL   Immature Granulocytes 0 Not Estab. %   Immature Grans (Abs) 0.0 0.0 - 0.1 x10E3/uL  Comprehensive metabolic panel  Result Value Ref Range   Glucose 72 65 - 99 mg/dL   BUN 20 6 - 24 mg/dL   Creatinine, Ser 0.94 0.76 - 1.27 mg/dL   GFR calc non Af Amer 93 >59 mL/min/1.73   GFR calc Af Amer 108 >59 mL/min/1.73   BUN/Creatinine Ratio 21 (H) 9 - 20   Sodium 142 134 - 144 mmol/L   Potassium 3.9 3.5 - 5.2 mmol/L   Chloride 104 96 - 106 mmol/L   CO2 22 20 - 29 mmol/L   Calcium 9.4 8.7 - 10.2 mg/dL   Total Protein 6.9 6.0 - 8.5 g/dL   Albumin 4.6 3.5 - 5.5 g/dL   Globulin, Total 2.3 1.5 - 4.5 g/dL   Albumin/Globulin Ratio 2.0 1.2 -  2.2   Bilirubin Total 0.5 0.0 - 1.2 mg/dL   Alkaline Phosphatase 87 39 - 117 IU/L   AST 26 0 - 40 IU/L   ALT 26 0 - 44 IU/L  Lipid panel  Result Value Ref Range   Cholesterol, Total 164 100 - 199 mg/dL   Triglycerides 234 (H) 0 - 149 mg/dL   HDL 37 (L) >39 mg/dL   VLDL Cholesterol Cal 47 (H) 5 - 40 mg/dL   LDL Calculated 80 0 - 99 mg/dL   Chol/HDL Ratio 4.4 0.0 - 5.0 ratio  TSH  Result Value Ref Range   TSH 2.040 0.450 - 4.500 uIU/mL  Urinalysis, Routine w reflex microscopic  Result Value Ref Range   Specific Gravity, UA 1.025 1.005 - 1.030   pH, UA 5.0 5.0 - 7.5   Color, UA Yellow Yellow   Appearance Ur Clear Clear   Leukocytes, UA Negative Negative   Protein, UA Negative Negative/Trace   Glucose, UA Negative Negative   Ketones, UA Negative Negative   RBC, UA Negative Negative   Bilirubin, UA Negative Negative   Urobilinogen, Ur 0.2 0.2 - 1.0 mg/dL   Nitrite, UA Negative Negative  PSA  Result Value Ref Range   Prostate Specific Ag, Serum 0.5 0.0 - 4.0 ng/mL  Testosterone  Result Value Ref Range   Testosterone 617 264 - 916 ng/dL      Assessment & Plan:   Problem List Items Addressed This Visit    None    Visit Diagnoses    SOB (shortness of breath)    -  Primary   EKG with no acute changes today, VSS with normal O2 sat, exam benign. Recommend pt return for further testing with Cardiology given exertional component   Relevant Orders   EKG 12-Lead (Completed)       Follow up plan: Return for Cardiology f/u 2/25 at 8:20 am.

## 2018-07-04 NOTE — Patient Instructions (Addendum)
Appointment scheduled with [facility] Cardiology with Dr. Rockey Situ February 25th 8:20AM

## 2018-07-10 NOTE — Progress Notes (Signed)
Cardiology Office Note  Date:  07/11/2018   ID:  Scott Chan, DOB 07/11/1965, MRN [id number]  PCP:  [name], MD   Chief Complaint  Patient presents with  . OTHER    12 month f/u no complaints today. Meds reviewed verbally with pt.    HPI:  Scott Chan is a 53 y.o. male  Episodes of chest pain.  Near syncope SOB OSA, unable to afford CPAP athritis Depression Non smoker Fm hx of CAD Patient reports history of possible MI in brother in his 46s.  Stents in his father in his 76s.  Who presents today for follow-up of chest pain and shortness of breath  INTERVAL HISTORY: The patient reports today for follow up. He continues feel lightheadedness about 1 minute into playing basketball with his friends, which he feels is more normal. However, he also notes feeling lightheaded walking from his car to his office. Symptoms are worse when he hasn't drank enough water.  He reports fainting as a child.   Taking Flomax, started 2 days ago. Presumably started by urology for BPH  He is wearing compression socks. Tries to stay hydrated  Denies any significant chest pain  Today's Blood pressure 108/70  Lab work reviewed in detail Total Chol 164/ LDL 80 CR 0.94 Glucose 72  CT coronary calcium score images pulled up and reviewed with him in detail Details as below  EKG personally reviewed by myself on todays visit Shows normal rhythm. 84 bpm.    OTHER PAST MEDICAL HISTORY REVIEWED BY ME FOR TODAY'S VISIT: CT coronary calcium score  01/2018 Coronary calcium score of 9. This was 70 percentile for age and sex matched control.  Stress test and echo previously ordered 07/08/2016, did not complete   PMH:   has a past medical history of Depression, GERD (gastroesophageal reflux disease), Orbital fracture (1986), and Sleep apnea.  PSH:    Past Surgical History:  Procedure Laterality Date  . COLONOSCOPY WITH PROPOFOL N/A 09/28/2016   Procedure: COLONOSCOPY WITH  PROPOFOL;  Surgeon: Lucilla Lame, MD;  Location: [facility] ENDOSCOPY;  Service: Endoscopy;  Laterality: N/A;  . nephrolithics  2003  . NOSE SURGERY    . TONSILLECTOMY  2007    Current Outpatient Medications  Medication Sig Dispense Refill  . ANDROGEL PUMP 20.25 MG/ACT (1.62%) GEL Place onto the skin.    Marland Kitchen escitalopram (LEXAPRO) 10 MG tablet Take 1 tablet (10 mg total) by mouth daily. 30 tablet 12  . lamoTRIgine (LAMICTAL) 100 MG tablet Take 1 tablet (100 mg total) by mouth daily. 30 tablet 12  . Multiple Vitamin (MULTIVITAMIN) capsule Take 1 capsule by mouth daily.    Marland Kitchen omeprazole (PRILOSEC) 20 MG capsule Take 1 capsule (20 mg total) by mouth daily as needed. (Patient taking differently: Take 20 mg by mouth daily. ) 90 capsule 3  . zolpidem (AMBIEN) 10 MG tablet Take 1 tablet (10 mg total) by mouth at bedtime as needed for sleep. 30 tablet 0   No current facility-administered medications for this visit.      Allergies:   Patient has no known allergies.   Social History:  The patient  reports that he has never smoked. He has never used smokeless tobacco. He reports current alcohol use. He reports that he does not use drugs.   Family History:   family history includes Heart attack in his brother; Hypertension in his brother and father; Ovarian cancer in his mother; Prostate cancer in his brother.  Review of Systems: Review of Systems  Constitutional: Negative.   Eyes: Negative.   Respiratory: Negative.   Cardiovascular: Negative.  Negative for chest pain.  Gastrointestinal: Negative.   Genitourinary: Negative.   Musculoskeletal: Negative.   Neurological: Positive for dizziness.  Psychiatric/Behavioral: Negative.   All other systems reviewed and are negative.    PHYSICAL EXAM: VS:  BP 108/70 (BP Location: Left Arm, Patient Position: Sitting, Cuff Size: Normal)   Pulse 84   Ht 6\' 1"  (1.854 m)   Wt 218 lb 8 oz (99.1 kg)   BMI 28.83 kg/m  , BMI Body mass index is 28.83  kg/m.  Constitutional:  oriented to person, place, and time. No distress.  HENT:  Head: Grossly normal Eyes:  no discharge. No scleral icterus.  Neck: No JVD, no carotid bruits  Cardiovascular: Regular rate and rhythm, no murmurs appreciated Pulmonary/Chest: Clear to auscultation bilaterally, no wheezes or rails Abdominal: Soft.  no distension.  no tenderness.  Musculoskeletal: Normal range of motion Neurological:  normal muscle tone. Coordination normal. No atrophy Skin: Skin warm and dry Psychiatric: normal affect, pleasant   Recent Labs: 09/13/2017: ALT 26; BUN 20; Creatinine, Ser 0.94; Hemoglobin 14.2; Platelets 234; Potassium 3.9; Sodium 142; TSH 2.040    Lipid Panel Lab Results  Component Value Date   CHOL 164 09/13/2017   HDL 37 (L) 09/13/2017   LDLCALC 80 09/13/2017   TRIG 234 (H) 09/13/2017      Wt Readings from Last 3 Encounters:  07/11/18 218 lb 8 oz (99.1 kg)  07/04/18 230 lb (104.3 kg)  09/13/17 212 lb (96.2 kg)       ASSESSMENT AND PLAN:  Chest pain with moderate risk for cardiac etiology Plan: EKG 12-Lead Low calcium score, no further work-up needed Atypical chest pain, no risk factors for coronary disease apart from family history Cholesterol is reasonable, non-smoker, nondiabetic Continues to play basketball during his lunch break  Lightheadedness possibly due to low Blood Pressure Plan: Monitor blood pressure Stay hydrated. Eat potato chips and drink Gatorade. Recommend wearing compression socks. If he has worsening symptoms could potentially try pharmacologic therapy to raise his blood pressure Recommended he research midodrine and florinef for blood pressure support .  This is only be used if we had to  SOB (shortness of breath) Plan: EKG 12-Lead Less of an issue, recommended he continue to work on his exercise and conditioning  OSA (obstructive sleep apnea) Wears CPAP   Gastroesophageal Reflux Disease, esophagitis presence not  specified On PPI  Disposition:   F/U 12 months   Total encounter time more than 25 minutes  Greater than 50% was spent in counseling and coordination of care with the patient   Margit Banda  Is acting as a scribe for Ida Rogue, M.D., Ph.D.   I have reviewed the above documentation for accuracy and completeness, and I agree with the above.    Orders Placed This Encounter  Procedures  . EKG 12-Lead    Signed, Esmond Plants, M.D., Ph.D. 07/11/2018  [place], [place]

## 2018-07-10 NOTE — Progress Notes (Deleted)
Cardiology Office Note  Date:  07/10/2018   ID:  RUMEAL CULLIPHER, DOB 07-Jul-1965, MRN [id number]  PCP:  [name], MD   No chief complaint on file.   HPI:  Scott Chan is a 53 y.o. male  Episodes of chest pain.  Near syncope SOB OSA, unable to afford CPAP athritis depression Fm hx of CAD Who presents today for follow-up of chest pain and shortness of breath  CT coronary calcium score  01/2018 Coronary calcium score of 9. This was 72 percentile for age and sex matched control.  Stress test and echo previously ordered 07/08/2016, did not complete  Recently played 1 hour basketball, had some mild shortness of breath running, no significant chest pain  Other times will be doing light activities with very light soreness in the chest, nothing severe Overall feels he is doing well  Reports that he does have CPAP Wears it often  He reports being diagnosed with likely severe sleep apnea but is unable to afford the CPAP.  Patient denies smoking.  Patient reports history of possible MI in brother in his 34s.  Stents in his father in his 79s.   Total chol 163, LDL 106 No recent lab work available  EKG personally reviewed by myself on todays visit Shows normal sinus rhythm rate 76 bpm no significant ST or T wave changes   PMH:   has a past medical history of Depression, GERD (gastroesophageal reflux disease), Orbital fracture (1986), and Sleep apnea.  PSH:    Past Surgical History:  Procedure Laterality Date  . COLONOSCOPY WITH PROPOFOL N/A 09/28/2016   Procedure: COLONOSCOPY WITH PROPOFOL;  Surgeon: Lucilla Lame, MD;  Location: [facility] ENDOSCOPY;  Service: Endoscopy;  Laterality: N/A;  . nephrolithics  2003  . NOSE SURGERY    . TONSILLECTOMY  2007    Current Outpatient Medications  Medication Sig Dispense Refill  . ANDROGEL PUMP 20.25 MG/ACT (1.62%) GEL Place onto the skin.    Marland Kitchen escitalopram (LEXAPRO) 10 MG tablet Take 1 tablet (10 mg total) by mouth  daily. 30 tablet 12  . lamoTRIgine (LAMICTAL) 100 MG tablet Take 1 tablet (100 mg total) by mouth daily. 30 tablet 12  . omeprazole (PRILOSEC) 20 MG capsule Take 1 capsule (20 mg total) by mouth daily as needed. 90 capsule 3  . zolpidem (AMBIEN) 10 MG tablet Take 1 tablet (10 mg total) by mouth at bedtime as needed for sleep. 30 tablet 0   No current facility-administered medications for this visit.      Allergies:   Patient has no known allergies.   Social History:  The patient  reports that he has never smoked. He has never used smokeless tobacco. He reports current alcohol use. He reports that he does not use drugs.   Family History:   family history includes Heart attack in his brother; Hypertension in his brother and father; Ovarian cancer in his mother; Prostate cancer in his brother.    Review of Systems: Review of Systems  Constitutional: Negative.   Respiratory: Positive for shortness of breath.   Cardiovascular: Positive for chest pain.  Gastrointestinal: Negative.   Musculoskeletal: Negative.   Neurological: Negative.   Psychiatric/Behavioral: Negative.   All other systems reviewed and are negative.    PHYSICAL EXAM: VS:  There were no vitals taken for this visit. , BMI There is no height or weight on file to calculate BMI. Constitutional:  oriented to person, place, and time. No distress.  HENT:  Head:  Normocephalic and atraumatic.  Eyes:  no discharge. No scleral icterus.  Neck: Normal range of motion. Neck supple. No JVD present.  Cardiovascular: Normal rate, regular rhythm, normal heart sounds and intact distal pulses. Exam reveals no gallop and no friction rub. No edema No murmur heard. Pulmonary/Chest: Effort normal and breath sounds normal. No stridor. No respiratory distress.  no wheezes.  no rales.  no tenderness.  Abdominal: Soft.  no distension.  no tenderness.  Musculoskeletal: Normal range of motion.  no  tenderness or deformity.  Neurological:  normal  muscle tone. Coordination normal. No atrophy Skin: Skin is warm and dry. No rash noted. not diaphoretic.  Psychiatric:  normal mood and affect. behavior is normal. Thought content normal.     Recent Labs: 09/13/2017: ALT 26; BUN 20; Creatinine, Ser 0.94; Hemoglobin 14.2; Platelets 234; Potassium 3.9; Sodium 142; TSH 2.040    Lipid Panel Lab Results  Component Value Date   CHOL 164 09/13/2017   HDL 37 (L) 09/13/2017   LDLCALC 80 09/13/2017   TRIG 234 (H) 09/13/2017      Wt Readings from Last 3 Encounters:  07/04/18 230 lb (104.3 kg)  09/13/17 212 lb (96.2 kg)  07/06/17 215 lb (97.5 kg)       ASSESSMENT AND PLAN:  Chest pain with moderate risk for cardiac etiology - Plan: EKG 12-Lead Atypical chest pain, no risk factors for coronary disease apart from family history Cholesterol is reasonable, non-smoker, nondiabetic Recently played basketball for 1 hour with minimal if any symptoms Discussed screening studies with him such as CT coronary calcium scoring, various types of stress testing He will call us if symptoms progress  SOB (shortness of breath) - Plan: EKG 12-Lead Likely secondary to conditioning, minimal symptoms even playing basketball for 1 hour  OSA (obstructive sleep apnea)  Wears CPAP   gastroesophageal reflux disease, esophagitis presence not specified On PPI  Disposition:   F/U 12 months as needed   Total encounter time more than 25 minutes  Greater than 50% was spent in counseling and coordination of care with the patient    No orders of the defined types were placed in this encounter.    Signed, Esmond Plants, M.D., Ph.D. 07/10/2018  [place], [place]

## 2018-07-11 ENCOUNTER — Encounter: Payer: Self-pay | Admitting: Cardiovascular Disease

## 2018-07-11 ENCOUNTER — Ambulatory Visit: Payer: PRIVATE HEALTH INSURANCE | Admitting: Cardiovascular Disease

## 2018-07-11 VITALS — BP 108/70 | HR 84 | Ht 73.0 in | Wt 218.5 lb

## 2018-07-11 DIAGNOSIS — R0602 Shortness of breath: Secondary | ICD-10-CM | POA: Diagnosis not present

## 2018-07-11 DIAGNOSIS — R079 Chest pain, unspecified: Secondary | ICD-10-CM

## 2018-07-11 DIAGNOSIS — G4730 Sleep apnea, unspecified: Secondary | ICD-10-CM

## 2018-07-11 DIAGNOSIS — G4733 Obstructive sleep apnea (adult) (pediatric): Secondary | ICD-10-CM | POA: Diagnosis not present

## 2018-07-11 DIAGNOSIS — K219 Gastro-esophageal reflux disease without esophagitis: Secondary | ICD-10-CM

## 2018-07-11 NOTE — Patient Instructions (Addendum)
Medication Instructions:  No changes  Research midodrine and florinef for blood pressure support   If you need a refill on your cardiac medications before your next appointment, please call your pharmacy.    Lab work: No new labs needed   If you have labs (blood work) drawn today and your tests are completely normal, you will receive your results only by: Marland Kitchen MyChart Message (if you have MyChart) OR . A paper copy in the mail If you have any lab test that is abnormal or we need to change your treatment, we will call you to review the results.   Testing/Procedures: No new testing needed   Follow-Up: At [facility], you and your health needs are our priority.  As part of our continuing mission to provide you with exceptional heart care, we have created designated Provider Care Teams.  These Care Teams include your primary Cardiologist (physician) and Advanced Practice Providers (APPs -  Physician Assistants and Nurse Practitioners) who all work together to provide you with the care you need, when you need it.  . You will need a follow up appointment in 12 months .   Please call our office 2 months in advance to schedule this appointment.    . Providers on your designated Care Team:   . Murray Hodgkins, NP . Christell Faith, PA-C . Marrianne Mood, PA-C  Any Other Special Instructions Will Be Listed Below (If Applicable).  For educational health videos Log in to : [email or web address] Or : [email or web address], password : [email or web address]

## 2018-08-16 NOTE — Pre-Procedure Instructions (Signed)
Scott Merritts, MD  Physician  Cardiology  Progress Notes  Signed  Encounter Date:  07/11/2018          Signed      Expand All Collapse All    Show:Clear all [x] Manual[x] Template[x] Copied  Added by: [x] Scott Merritts, MD[x] Margit Banda  [] Hover for details Cardiology Office Note  Date:  07/11/2018   ID:  Scott Chan, DOB Mar 08, 1966, MRN [id number]  PCP:  Scott Maple, MD       Chief Complaint  Patient presents with  . OTHER    12 month f/u no complaints today. Meds reviewed verbally with pt.    HPI:  Scott Corniel Prichardis a 53 y.o.male Episodes of chest pain.  Near syncope SOB OSA, unable to afford CPAP athritis Depression Non smoker Fm hx of CAD Patient reports history of possible MI in brother in his 52s.  Stents in his father in his 52s.  Who presents today for follow-up of chest pain and shortness of breath  INTERVAL HISTORY: The patient reports today for follow up. He continues feel lightheadedness about 1 minute into playing basketball with his friends, which he feels is more normal. However, he also notes feeling lightheaded walking from his car to his office. Symptoms are worse when he hasn't drank enough water.  He reports fainting as a child.   Taking Flomax, started 2 days ago. Presumably started by urology for BPH  He is wearing compression socks. Tries to stay hydrated  Denies any significant chest pain  Today's Blood pressure 108/70  Lab work reviewed in detail Total Chol 164/ LDL 80 CR 0.94 Glucose 72  CT coronary calcium score images pulled up and reviewed with him in detail Details as below  EKG personally reviewed by myself on todays visit Shows normal rhythm. 84 bpm.    OTHER PAST MEDICAL HISTORY REVIEWED BY ME FOR TODAY'S VISIT: CT coronary calcium score  01/2018 Coronary calcium score of 9. This was 67 percentile for age and sex matched control.  Stress test and echo previously  ordered 07/08/2016, did not complete   PMH:   has a past medical history of Depression, GERD (gastroesophageal reflux disease), Orbital fracture (1986), and Sleep apnea.  PSH:         Past Surgical History:  Procedure Laterality Date  . COLONOSCOPY WITH PROPOFOL N/A 09/28/2016   Procedure: COLONOSCOPY WITH PROPOFOL;  Surgeon: Scott Lame, MD;  Location: [facility] ENDOSCOPY;  Service: Endoscopy;  Laterality: N/A;  . nephrolithics  2003  . NOSE SURGERY    . TONSILLECTOMY  2007          Current Outpatient Medications  Medication Sig Dispense Refill  . ANDROGEL PUMP 20.25 MG/ACT (1.62%) GEL Place onto the skin.    Marland Kitchen escitalopram (LEXAPRO) 10 MG tablet Take 1 tablet (10 mg total) by mouth daily. 30 tablet 12  . lamoTRIgine (LAMICTAL) 100 MG tablet Take 1 tablet (100 mg total) by mouth daily. 30 tablet 12  . Multiple Vitamin (MULTIVITAMIN) capsule Take 1 capsule by mouth daily.    Marland Kitchen omeprazole (PRILOSEC) 20 MG capsule Take 1 capsule (20 mg total) by mouth daily as needed. (Patient taking differently: Take 20 mg by mouth daily. ) 90 capsule 3  . zolpidem (AMBIEN) 10 MG tablet Take 1 tablet (10 mg total) by mouth at bedtime as needed for sleep. 30 tablet 0   No current facility-administered medications for this visit.      Allergies:   Patient has no known  allergies.   Social History:  The patient  reports that he has never smoked. He has never used smokeless tobacco. He reports current alcohol use. He reports that he does not use drugs.   Family History:   family history includes Heart attack in his brother; Hypertension in his brother and father; Ovarian cancer in his mother; Prostate cancer in his brother.    Review of Systems: Review of Systems  Constitutional: Negative.   Eyes: Negative.   Respiratory: Negative.   Cardiovascular: Negative.  Negative for chest pain.  Gastrointestinal: Negative.   Genitourinary: Negative.   Musculoskeletal: Negative.    Neurological: Positive for dizziness.  Psychiatric/Behavioral: Negative.   All other systems reviewed and are negative.    PHYSICAL EXAM: VS:  BP 108/70 (BP Location: Left Arm, Patient Position: Sitting, Cuff Size: Normal)   Pulse 84   Ht 6\' 1"  (1.854 m)   Wt 218 lb 8 oz (99.1 kg)   BMI 28.83 kg/m  , BMI Body mass index is 28.83 kg/m.  Constitutional:  oriented to person, place, and time. No distress.  HENT:  Head: Grossly normal Eyes:  no discharge. No scleral icterus.  Neck: No JVD, no carotid bruits  Cardiovascular: Regular rate and rhythm, no murmurs appreciated Pulmonary/Chest: Clear to auscultation bilaterally, no wheezes or rails Abdominal: Soft.  no distension.  no tenderness.  Musculoskeletal: Normal range of motion Neurological:  normal muscle tone. Coordination normal. No atrophy Skin: Skin warm and dry Psychiatric: normal affect, pleasant   Recent Labs: 09/13/2017: ALT 26; BUN 20; Creatinine, Ser 0.94; Hemoglobin 14.2; Platelets 234; Potassium 3.9; Sodium 142; TSH 2.040    Lipid Panel [facility]       Lab Results  Component Value Date   CHOL 164 09/13/2017   HDL 37 (L) 09/13/2017   LDLCALC 80 09/13/2017   TRIG 234 (H) 09/13/2017           Wt Readings from Last 3 Encounters:  07/11/18 218 lb 8 oz (99.1 kg)  07/04/18 230 lb (104.3 kg)  09/13/17 212 lb (96.2 kg)       ASSESSMENT AND PLAN:  Chest pain with moderate risk for cardiac etiology Plan: EKG 12-Lead Low calcium score, no further work-up needed Atypical chest pain, no risk factors for coronary disease apart from family history Cholesterol is reasonable, non-smoker, nondiabetic Continues to play basketball during his lunch break  Lightheadedness possibly due to low Blood Pressure Plan: Monitor blood pressure Stay hydrated. Eat potato chips and drink Gatorade. Recommend wearing compression socks. If he has worsening symptoms could potentially try pharmacologic  therapy to raise his blood pressure Recommended he research midodrine and florinef for blood pressure support .  This is only be used if we had to  SOB (shortness of breath) Plan: EKG 12-Lead Less of an issue, recommended he continue to work on his exercise and conditioning  OSA (obstructive sleep apnea) Wears CPAP   Gastroesophageal Reflux Disease, esophagitis presence not specified On PPI  Disposition:   F/U 12 months   Total encounter time more than 25 minutes  Greater than 50% was spent in counseling and coordination of care with the patient   Margit Banda  Is acting as a scribe for Ida Rogue, M.D., Ph.D.   I have reviewed the above documentation for accuracy and completeness, and I agree with the above.       Orders Placed This Encounter  Procedures  . EKG 12-Lead    Signed, Esmond Plants, M.D., Ph.D. 07/11/2018  [place], [place]         Electronically signed by Scott Merritts, MD at 07/11/2018 1:53 PM     Office Visit on 07/11/2018       Revision History       Detailed Report

## 2018-08-17 ENCOUNTER — Other Ambulatory Visit: Payer: Self-pay

## 2018-08-17 ENCOUNTER — Encounter
Admission: RE | Admit: 2018-08-17 | Discharge: 2018-08-17 | Disposition: A | Discharge status: Home / Self Care | Payer: PRIVATE HEALTH INSURANCE | Source: Ambulatory Visit | Attending: Urology | Admitting: Urology

## 2018-08-17 HISTORY — DX: Personal history of urinary calculi: Z87.442

## 2018-08-17 HISTORY — DX: Hypotension, unspecified: I95.9

## 2018-08-17 NOTE — Patient Instructions (Signed)
Your procedure is scheduled on: 08-22-18 TUESDAY Report to Same Day Surgery 2nd floor medical mall [facility] Entrance-take elevator on left to 2nd floor.  Check in with surgery information desk.) To find out your arrival time please call [phone] between 1PM - 3PM on 08-21-18 MONDAY  Remember: Instructions that are not followed completely may result in serious medical risk, up to and including death, or upon the discretion of your surgeon and anesthesiologist your surgery may need to be rescheduled.    _x___ 1. Do not eat food after midnight the night before your procedure. NO GUM OR CANDY AFTER MIDNIGHT.  You may drink clear liquids up to 2 hours before you are scheduled to arrive at the hospital for your procedure.  Do not drink clear liquids within 2 hours of your scheduled arrival to the hospital.  Clear liquids include  --Water or Apple juice without pulp  --Clear carbohydrate beverage such as ClearFast or Gatorade  --Black Coffee or Clear Tea (No milk, no creamers, do not add anything to the coffee or Tea   ____Ensure clear carbohydrate drink on the way to the hospital for bariatric patients  ____Ensure clear carbohydrate drink 3 hours before surgery for Dr Dwyane Luo patients if physician instructed.     __x__ 2. No Alcohol for 24 hours before or after surgery.   __x__3. No Smoking or e-cigarettes for 24 prior to surgery.  Do not use any chewable tobacco products for at least 6 hour prior to surgery   ____  4. Bring all medications with you on the day of surgery if instructed.    __x__ 5. Notify your doctor if there is any change in your medical condition     (cold, fever, infections).    x___6. On the morning of surgery brush your teeth with toothpaste and water.  You may rinse your mouth with mouth wash if you wish.  Do not swallow any toothpaste or mouthwash.   Do not wear jewelry, make-up, hairpins, clips or nail polish.  Do not wear lotions, powders, or perfumes. You  may wear deodorant.  Do not shave 48 hours prior to surgery. Men may shave face and neck.  Do not bring valuables to the hospital.    [facility] is not responsible for any belongings or valuables.               Contacts, dentures or bridgework may not be worn into surgery.  Leave your suitcase in the car. After surgery it may be brought to your room.  For patients admitted to the hospital, discharge time is determined by your treatment team.  _  Patients discharged the day of surgery will not be allowed to drive home.  You will need someone to drive you home and stay with you the night of your procedure.    Please read over the following fact sheets that you were given:   [facility] Preparing for Surgery   _x___ TAKE THE FOLLOWING MEDICATION THE MORNING OF SURGERY WITH A SMALL SIP OF WATER. These include:  1. LEXAPRO (ESCITALOPRAM)  2. LAMICTAL (LAMOTRIGINE)  3. PRILOSEC (OMEPRAZOLE)  4. TAKE AN EXTRA PRILOSEC THE NIGHT BEFORE YOUR SURGERY  5.  6.  ____Fleets enema or Magnesium Citrate as directed.   ____ Use CHG Soap or sage wipes as directed on instruction sheet   ____ Use inhalers on the day of surgery and bring to hospital day of surgery  ____ Stop Metformin and Janumet 2 days prior to  surgery.    ____ Take 1/2 of usual insulin dose the night before surgery and none on the morning surgery.   ____ Follow recommendations from Cardiologist, Pulmonologist or PCP regarding stopping Aspirin, Coumadin, Plavix ,Eliquis, Effient, or Pradaxa, and Pletal.  X____Stop Anti-inflammatories such as Advil, Aleve, Ibuprofen, Motrin, Naproxen, Naprosyn, Goodies powders or aspirin products NOW-OK to take Tylenol    ____ Stop supplements until after surgery.    ____ Bring C-Pap to the hospital.

## 2018-08-21 NOTE — H&P (Signed)
NAME: Scott Chan, Scott Chan MEDICAL RECORD [id number] ACCOUNT [id number] DATE OF BIRTH:[birth year] FACILITY: [facility] LOCATION: [facility] PHYSICIAN:MICHAEL Farrel Conners, MD  HISTORY AND PHYSICAL  DATE OF ADMISSION:  08/22/2018  CHIEF COMPLAINT:  Bladder cancer.  HISTORY OF PRESENT ILLNESS:  The patient is a 53 year old white male who presented to the office with significant difficulty voiding.  During the evaluation, he underwent flexible cystoscopy and was found to have a 10 mm bladder tumor.  He comes in now  for transurethral resection of the bladder tumor.  ALLERGIES:  No drug allergies.  CURRENT MEDICATIONS:  Include omeprazole, Abilify, Lexapro and multivitamins.  PAST SURGICAL HISTORY:  Repair of fractured nose in 1986.  Colonoscopy in 2018.  Tonsillectomy in 2010.  PAST AND CURRENT MEDICAL CONDITIONS:   1.  GERD. 2.  BPH. 3.  Hypogonadism.  REVIEW OF SYSTEMS:  The patient denies chest pain, shortness of breath, diabetes or stroke.  SOCIAL HISTORY:  The patient denied tobacco use.  Consumes 2 alcoholic beverages per week.  FAMILY HISTORY:  Negative for urological disease.  PHYSICAL EXAMINATION: GENERAL:  A well-nourished, white male in no acute distress. HEENT:  Sclerae were clear.  Pupils are equally round, reactive to light and accommodation.  Extraocular movements are intact. NECK:  No palpable cervical adenopathy. PULMONARY:  Lungs clear to auscultation. CARDIOVASCULAR:  Regular rhythm and rate. ABDOMEN:  Soft, nontender abdomen. GENITOURINARY:  Circumcised.  Testes were smooth, nontender, but atrophic, 16 ____ in size each. RECTAL:  50 gram smooth, nontender prostate. NEUROMUSCULAR:  Grossly intact.  IMPRESSION:  Bladder cancer.  PLAN:  Transurethral resection of bladder cancer and then instillation of bladder with mitomycin.  AN/NUANCE  D:08/21/2018 T:08/21/2018 JOB:[id number]

## 2018-08-22 ENCOUNTER — Ambulatory Visit
Admission: RE | Admit: 2018-08-22 | Discharge: 2018-08-22 | Disposition: A | Discharge status: Home / Self Care | Payer: PRIVATE HEALTH INSURANCE | Attending: Urology | Admitting: Urology

## 2018-08-22 ENCOUNTER — Encounter
Admission: RE | Disposition: A | Discharge status: Home / Self Care | Payer: Self-pay | Source: Home / Self Care | Attending: Urology

## 2018-08-22 ENCOUNTER — Ambulatory Visit: Payer: PRIVATE HEALTH INSURANCE | Admitting: Anesthesiology

## 2018-08-22 ENCOUNTER — Other Ambulatory Visit: Payer: Self-pay

## 2018-08-22 DIAGNOSIS — R39198 Other difficulties with micturition: Secondary | ICD-10-CM | POA: Insufficient documentation

## 2018-08-22 DIAGNOSIS — N401 Enlarged prostate with lower urinary tract symptoms: Secondary | ICD-10-CM | POA: Insufficient documentation

## 2018-08-22 DIAGNOSIS — G4730 Sleep apnea, unspecified: Secondary | ICD-10-CM | POA: Diagnosis not present

## 2018-08-22 DIAGNOSIS — K219 Gastro-esophageal reflux disease without esophagitis: Secondary | ICD-10-CM | POA: Insufficient documentation

## 2018-08-22 DIAGNOSIS — F329 Major depressive disorder, single episode, unspecified: Secondary | ICD-10-CM | POA: Diagnosis not present

## 2018-08-22 DIAGNOSIS — D494 Neoplasm of unspecified behavior of bladder: Secondary | ICD-10-CM | POA: Diagnosis not present

## 2018-08-22 HISTORY — PX: TRANSURETHRAL RESECTION OF BLADDER TUMOR: SHX2575

## 2018-08-22 SURGERY — TURBT (TRANSURETHRAL RESECTION OF BLADDER TUMOR)
Anesthesia: General

## 2018-08-22 MED ORDER — BELLADONNA ALKALOIDS-OPIUM 16.2-30 MG RE SUPP
RECTAL | Status: DC | PRN
  Administered 2018-08-22: 30 mg via RECTAL

## 2018-08-22 MED ORDER — EPHEDRINE SULFATE 50 MG/ML IJ SOLN
INTRAMUSCULAR | Status: DC | PRN
  Administered 2018-08-22: 10 mg via INTRAVENOUS

## 2018-08-22 MED ORDER — LIDOCAINE HCL URETHRAL/MUCOSAL 2 % EX GEL
CUTANEOUS | Status: AC
  Filled 2018-08-22: qty 10

## 2018-08-22 MED ORDER — ONDANSETRON HCL 4 MG/2ML IJ SOLN
INTRAMUSCULAR | Status: DC | PRN
  Administered 2018-08-22: 4 mg via INTRAVENOUS

## 2018-08-22 MED ORDER — FENTANYL CITRATE (PF) 100 MCG/2ML IJ SOLN: 25.0000 ug | INTRAMUSCULAR | Status: DC | PRN

## 2018-08-22 MED ORDER — MIDAZOLAM HCL 2 MG/2ML IJ SOLN
INTRAMUSCULAR | Status: DC | PRN
  Administered 2018-08-22: 2 mg via INTRAVENOUS

## 2018-08-22 MED ORDER — CEFAZOLIN SODIUM-DEXTROSE 1-4 GM/50ML-% IV SOLN
INTRAVENOUS | Status: AC
  Filled 2018-08-22: qty 50

## 2018-08-22 MED ORDER — URIBEL 118 MG PO CAPS: 1.0000 | ORAL_CAPSULE | Freq: Four times a day (QID) | ORAL | 3 refills | Status: DC | PRN

## 2018-08-22 MED ORDER — FENTANYL CITRATE (PF) 100 MCG/2ML IJ SOLN
INTRAMUSCULAR | Status: AC
  Filled 2018-08-22: qty 2

## 2018-08-22 MED ORDER — BELLADONNA ALKALOIDS-OPIUM 16.2-60 MG RE SUPP
RECTAL | Status: AC
  Filled 2018-08-22: qty 1

## 2018-08-22 MED ORDER — LIDOCAINE HCL URETHRAL/MUCOSAL 2 % EX GEL
CUTANEOUS | Status: DC | PRN
  Administered 2018-08-22: 1

## 2018-08-22 MED ORDER — PROPOFOL 10 MG/ML IV BOLUS
INTRAVENOUS | Status: DC | PRN
  Administered 2018-08-22: 30 mg via INTRAVENOUS
  Administered 2018-08-22: 50 mg via INTRAVENOUS
  Administered 2018-08-22: 170 mg via INTRAVENOUS

## 2018-08-22 MED ORDER — LIDOCAINE HCL (CARDIAC) PF 100 MG/5ML IV SOSY
PREFILLED_SYRINGE | INTRAVENOUS | Status: DC | PRN
  Administered 2018-08-22: 60 mg via INTRAVENOUS

## 2018-08-22 MED ORDER — LACTATED RINGERS IV SOLN
INTRAVENOUS | Status: DC
  Administered 2018-08-22 (×2): via INTRAVENOUS

## 2018-08-22 MED ORDER — PROPOFOL 10 MG/ML IV BOLUS
INTRAVENOUS | Status: AC
  Filled 2018-08-22: qty 20

## 2018-08-22 MED ORDER — PROMETHAZINE HCL 25 MG/ML IJ SOLN: 6.2500 mg | INTRAMUSCULAR | Status: DC | PRN

## 2018-08-22 MED ORDER — SEVOFLURANE IN SOLN
RESPIRATORY_TRACT | Status: AC
  Filled 2018-08-22: qty 250

## 2018-08-22 MED ORDER — MIDAZOLAM HCL 2 MG/2ML IJ SOLN
INTRAMUSCULAR | Status: AC
  Filled 2018-08-22: qty 2

## 2018-08-22 MED ORDER — MITOMYCIN CHEMO FOR BLADDER INSTILLATION 40 MG
INTRAVENOUS | Status: DC | PRN
  Administered 2018-08-22: 40 mg via INTRAVESICAL

## 2018-08-22 MED ORDER — CEFAZOLIN SODIUM-DEXTROSE 1-4 GM/50ML-% IV SOLN
1.0000 g | Freq: Once | INTRAVENOUS | Status: AC
  Administered 2018-08-22: 10:00:00 1 g via INTRAVENOUS

## 2018-08-22 MED ORDER — FENTANYL CITRATE (PF) 100 MCG/2ML IJ SOLN
INTRAMUSCULAR | Status: DC | PRN
  Administered 2018-08-22 (×3): 50 ug via INTRAVENOUS

## 2018-08-22 MED ORDER — CIPROFLOXACIN HCL 500 MG PO TABS: 500.0000 mg | ORAL_TABLET | Freq: Two times a day (BID) | ORAL | 0 refills | Status: DC

## 2018-08-22 MED ORDER — LIDOCAINE HCL (PF) 2 % IJ SOLN
INTRAMUSCULAR | Status: AC
  Filled 2018-08-22: qty 10

## 2018-08-22 SURGICAL SUPPLY — 24 items
BAG DRAIN CYSTO-URO LG1000N (MISCELLANEOUS) ×3 IMPLANT
BAG URINE DRAINAGE (UROLOGICAL SUPPLIES) ×3 IMPLANT
CATH FOL 2WAY LX 18X30 (CATHETERS) ×2 IMPLANT
CATH FOLEY 2WAY  5CC 20FR SIL (CATHETERS)
CATH FOLEY 2WAY 5CC 20FR SIL (CATHETERS) ×1 IMPLANT
COVER WAND RF STERILE (DRAPES) ×1 IMPLANT
DRSG TELFA 4X3 1S NADH ST (GAUZE/BANDAGES/DRESSINGS) ×3 IMPLANT
ELECT LOOP 22F BIPOLAR SML (ELECTROSURGICAL) ×3
ELECTRODE LOOP 22F BIPOLAR SML (ELECTROSURGICAL) IMPLANT
GLOVE BIO SURGEON STRL SZ7.5 (GLOVE) ×3 IMPLANT
GOWN STRL REUS W/ TWL LRG LVL4 (GOWN DISPOSABLE) ×1 IMPLANT
GOWN STRL REUS W/ TWL XL LVL3 (GOWN DISPOSABLE) ×1 IMPLANT
GOWN STRL REUS W/TWL LRG LVL4 (GOWN DISPOSABLE) ×2
GOWN STRL REUS W/TWL XL LVL3 (GOWN DISPOSABLE) ×2
KIT TURNOVER CYSTO (KITS) ×3 IMPLANT
LOOP CUT BIPOLAR 24F LRG (ELECTROSURGICAL) ×1 IMPLANT
NDL SAFETY ECLIPSE 18X1.5 (NEEDLE) IMPLANT
NEEDLE HYPO 18GX1.5 SHARP (NEEDLE) ×2
PACK CYSTO AR (MISCELLANEOUS) ×3 IMPLANT
SET IRRIG Y TYPE TUR BLADDER L (SET/KITS/TRAYS/PACK) ×4 IMPLANT
SOL .9 NS 3000ML IRR  AL (IV SOLUTION) ×8
SOL .9 NS 3000ML IRR UROMATIC (IV SOLUTION) ×4 IMPLANT
SYRINGE IRR TOOMEY STRL 70CC (SYRINGE) ×3 IMPLANT
WATER STERILE IRR 1000ML POUR (IV SOLUTION) ×3 IMPLANT

## 2018-08-22 NOTE — Anesthesia Preprocedure Evaluation (Signed)
Anesthesia Evaluation  Patient identified by MRN, date of birth, ID band Patient awake    Reviewed: Allergy & Precautions, H&P , NPO status , Patient's Chart, lab work & pertinent test results, reviewed documented beta blocker date and time   History of Anesthesia Complications Negative for: history of anesthetic complications  Airway Mallampati: II   Neck ROM: full    Dental  (+) Poor Dentition, Dental Advidsory Given   Pulmonary neg shortness of breath, sleep apnea and Continuous Positive Airway Pressure Ventilation , neg COPD, neg recent URI,           Cardiovascular Exercise Tolerance: Good negative cardio ROS       Neuro/Psych PSYCHIATRIC DISORDERS Depression negative neurological ROS  negative psych ROS   GI/Hepatic negative GI ROS, Neg liver ROS, GERD  Medicated,  Endo/Other  negative endocrine ROS  Renal/GU negative Renal ROS  negative genitourinary   Musculoskeletal   Abdominal   Peds  Hematology negative hematology ROS (+)   Anesthesia Other Findings Past Medical History: No date: Depression No date: GERD (gastroesophageal reflux disease) 1986: Orbital fracture ([place]) No date: Sleep apnea Past Surgical History: 2003: nephrolithics No date: NOSE SURGERY 2007: TONSILLECTOMY   Reproductive/Obstetrics negative OB ROS                             Anesthesia Physical  Anesthesia Plan  ASA: II  Anesthesia Plan: General   Post-op Pain Management:    Induction: Intravenous  PONV Risk Score and Plan: 2 and Ondansetron, Dexamethasone, Midazolam, Promethazine and Treatment may vary due to age or medical condition  Airway Management Planned: LMA and Oral ETT  Additional Equipment:   Intra-op Plan:   Post-operative Plan: Extubation in OR  Informed Consent: I have reviewed the patients History and Physical, chart, labs and discussed the procedure including the risks,  benefits and alternatives for the proposed anesthesia with the patient or authorized representative who has indicated his/her understanding and acceptance.     Dental Advisory Given  Plan Discussed with: CRNA  Anesthesia Plan Comments:         Anesthesia Quick Evaluation

## 2018-08-22 NOTE — Transfer of Care (Signed)
Immediate Anesthesia Transfer of Care Note  Patient: Scott Chan  Procedure(s) Performed: TRANSURETHRAL RESECTION OF BLADDER TUMOR (TURBT) BT (N/A )  Patient Location: PACU  Anesthesia Type:General  Level of Consciousness: sedated  Airway & Oxygen Therapy: Patient Spontanous Breathing and Patient connected to face mask oxygen  Post-op Assessment: Report given to RN and Post -op Vital signs reviewed and stable  Post vital signs: Reviewed and stable  Last Vitals:  Vitals Value Taken Time  BP 139/81 08/22/2018 11:17 AM  Temp 36.6 C 08/22/2018 11:17 AM  Pulse 82 08/22/2018 11:17 AM  Resp 10 08/22/2018 11:17 AM  SpO2 99 % 08/22/2018 11:17 AM    Last Pain:  Vitals:   08/22/18 1117  TempSrc:   PainSc: Asleep         Complications: No apparent anesthesia complications

## 2018-08-22 NOTE — Anesthesia Post-op Follow-up Note (Signed)
Anesthesia QCDR form completed.        

## 2018-08-22 NOTE — Discharge Instructions (Addendum)
AMBULATORY SURGERY  DISCHARGE INSTRUCTIONS   1) The drugs that you were given will stay in your system until tomorrow so for the next 24 hours you should not:  A) Drive an automobile B) Make any legal decisions C) Drink any alcoholic beverage   2) You may resume regular meals tomorrow.  Today it is better to start with liquids and gradually work up to solid foods.  You may eat anything you prefer, but it is better to start with liquids, then soup and crackers, and gradually work up to solid foods.   3) Please notify your doctor immediately if you have any unusual bleeding, trouble breathing, redness and pain at the surgery site, drainage, fever, or pain not relieved by medication.    4) Additional Instructions:        Please contact your physician with any problems or Same Day Surgery at [phone], Monday through Friday 6 am to 4 pm, or [place] at [facility] number at [phone].   Transurethral Resection of Bladder Tumor, Care After This sheet gives you information about how to care for yourself after your procedure. Your health care provider may also give you more specific instructions. If you have problems or questions, contact your health care provider. What can I expect after the procedure? After the procedure, it is common to have:  A small amount of blood in your urine for up to 2 weeks.  Soreness or mild pain from your catheter. After your catheter is removed, you may have mild soreness, especially when urinating.  Pain in your lower abdomen. Follow these instructions at home: Medicines   Take over-the-counter and prescription medicines only as told by your health care provider.  If you were prescribed an antibiotic medicine, take it as told by your health care provider. Do not stop taking the antibiotic even if you start to feel better.  Do not drive for 24 hours if you were given a sedative during your procedure.  Ask your health care  provider if the medicine prescribed to you: ? Requires you to avoid driving or using heavy machinery. ? Can cause constipation. You may need to take these actions to prevent or treat constipation:  Take over-the-counter or prescription medicines.  Eat foods that are high in fiber, such as beans, whole grains, and fresh fruits and vegetables.  Limit foods that are high in fat and processed sugars, such as fried or sweet foods. Activity  Return to your normal activities as told by your health care provider. Ask your health care provider what activities are safe for you.  Do not lift anything that is heavier than 10 lb (4.5 kg), or the limit that you are told, until your health care provider says that it is safe.  Avoid intense physical activity for as long as told by your health care provider.  Rest as told by your health care provider.  Avoid sitting for a long time without moving. Get up to take short walks every 1-2 hours. This is important to improve blood flow and breathing. Ask for help if you feel weak or unsteady. General instructions   Do not drink alcohol for as long as told by your health care provider. This is especially important if you are taking prescription pain medicines.  Do not take baths, swim, or use a hot tub until your health care provider approves. Ask your health care provider if you may take showers. You may only be allowed to take sponge baths.  If you have a catheter, follow instructions from your health care provider about caring for your catheter and your drainage bag.  Drink enough fluid to keep your urine pale yellow.  Wear compression stockings as told by your health care provider. These stockings help to prevent blood clots and reduce swelling in your legs.  Keep all follow-up visits as told by your health care provider. This is important. ? You will need to be followed closely with regular checks of your bladder and urethra (cystoscopies) to make  sure that the cancer does not come back. Contact a health care provider if:  You have pain that gets worse or does not improve with medicine.  You have blood in your urine for more than 2 weeks.  You have cloudy or bad-smelling urine.  You become constipated. Signs of constipation may include having: ? Fewer than three bowel movements in a week. ? Difficulty having a bowel movement. ? Stools that are dry, hard, or larger than normal.  You have a fever. Get help right away if:  You have: ? Severe pain. ? Bright red blood in your urine. ? Blood clots in your urine. ? A lot of blood in your urine.  Your catheter has been removed and you are not able to urinate.  You have a catheter in place and the catheter is not draining urine. Summary  After your procedure, it is common to have a small amount of blood in your urine, soreness or mild pain from your catheter, and pain in your lower abdomen.  Take over-the-counter and prescription medicines only as told by your health care provider.  Rest as told by your health care provider. Follow your health care provider's instructions about returning to normal activities. Ask what activities are safe for you.  If you have a catheter, follow instructions from your health care provider about caring for your catheter and your drainage bag.  Get help right away if you cannot urinate, you have severe pain, or you have bright red blood or blood clots in your urine. This information is not intended to replace advice given to you by your health care provider. Make sure you discuss any questions you have with your health care provider. Document Released: 04/14/2015 Document Revised: 12/01/2017 Document Reviewed: 12/01/2017 Elsevier Interactive Patient Education  2019 [employer] OFFICE TO MAKE YOUR FOLLOW UP APPOINTMENT.

## 2018-08-22 NOTE — Anesthesia Procedure Notes (Signed)
Procedure Name: LMA Insertion Date/Time: 08/22/2018 10:25 AM Performed by: Allean Found, CRNA Pre-anesthesia Checklist: Patient identified, Patient being monitored, Timeout performed, Emergency Drugs available and Suction available Patient Re-evaluated:Patient Re-evaluated prior to induction Oxygen Delivery Method: Circle system utilized Preoxygenation: Pre-oxygenation with 100% oxygen Induction Type: IV induction Ventilation: Mask ventilation without difficulty LMA: LMA inserted LMA Size: 5.0 Tube type: Oral Number of attempts: 1 Placement Confirmation: positive ETCO2 and breath sounds checked- equal and bilateral Tube secured with: Tape Dental Injury: Teeth and Oropharynx as per pre-operative assessment

## 2018-08-22 NOTE — Op Note (Signed)
Preoperative diagnosis: Bladder tumor  Postoperative diagnosis: Bladder tumors  Procedure: 1.  Transurethral resection of bladder tumors                      2.  Instillation of mitomycin-C into the bladder  Surgeon: Otelia Limes. Yves Dill MD  Anesthesia: General  Indications:See the history and physical. After informed consent the above procedure(s) were requested     Technique and findings:After adequate general anesthesia had been obtained the patient was placed into dorsal lithotomy position and the perineum was prepped and draped in the usual fashion.  The urethral meatus was dilated up to 9 [place] with male dilators.  The 27 French resectoscope sheath was then advanced into the bladder with the obturator in place.  The cystoscope was coupled to the camera and placed into the sheath.  Bladder was then thoroughly inspected.  Both ureteral orifices were identified and had clear efflux.  A 10 mm papillary bladder tumor was identified just left of the midline on the left side proximal to the orifice.  A second smaller lesion measuring 5 mm was identified distal to this lesion.  Both lesions were removed with the loop and base cauterized with bipolar.Marland Kitchen  Specimens were submitted to pathology.  This point the scope was removed and 10 cc of viscous Xylocaine instilled within the urethra.  An 80 French Foley catheter was placed and 40 mg of mitomycin-C instilled within the bladder and the catheter was clamped.  A B&O suppository was placed.  The procedure was then terminated and patient transferred to the recovery room in stable condition.

## 2018-08-22 NOTE — H&P (Signed)
Date of Initial H&P: 08/22/18  History reviewed, patient examined, no change in status, stable for surgery.

## 2018-08-23 LAB — SURGICAL PATHOLOGY

## 2018-08-23 NOTE — Anesthesia Postprocedure Evaluation (Signed)
Anesthesia Post Note  Patient: Scott Chan  Procedure(s) Performed: TRANSURETHRAL RESECTION OF BLADDER TUMOR (TURBT) BT (N/A )  Patient location during evaluation: PACU Anesthesia Type: General Level of consciousness: awake and alert Pain management: pain level controlled Vital Signs Assessment: post-procedure vital signs reviewed and stable Respiratory status: spontaneous breathing, nonlabored ventilation, respiratory function stable and patient connected to nasal cannula oxygen Cardiovascular status: blood pressure returned to baseline and stable Postop Assessment: no apparent nausea or vomiting Anesthetic complications: no     Last Vitals:  Vitals:   08/22/18 1217 08/22/18 1245  BP: (!) 134/95 (!) 143/91  Pulse: 80   Resp: 18 16  Temp: 36.6 C (!) 36.3 C  SpO2: 100% 100%    Last Pain:  Vitals:   08/22/18 1245  TempSrc: Temporal  PainSc:                  Martha Clan

## 2018-09-26 ENCOUNTER — Other Ambulatory Visit: Payer: Self-pay | Admitting: Family Medicine

## 2018-09-26 NOTE — Telephone Encounter (Signed)
Requested medication (s) are due for refill today: yes  Requested medication (s) are on the active medication list: yes  Last refill: 09/13/17  Future visit scheduled: No  Notes to clinic:  Not delegated    Requested Prescriptions  Pending Prescriptions Disp Refills   lamoTRIgine (LAMICTAL) 100 MG tablet [Pharmacy Med Name: LAMOTRIGINE 100 MG TAB] 30 tablet 12    Sig: TAKE 1 TABLET BY MOUTH DAILY     Not Delegated - Neurology:  Anticonvulsants Failed - 09/26/2018  9:18 AM      Failed - This refill cannot be delegated      Failed - HCT in normal range and within 360 days    Hematocrit  Date Value Ref Range Status  09/13/2017 42.9 37.5 - 51.0 % Final         Failed - HGB in normal range and within 360 days    Hemoglobin  Date Value Ref Range Status  09/13/2017 14.2 13.0 - 17.7 g/dL Final         Failed - PLT in normal range and within 360 days    Platelets  Date Value Ref Range Status  09/13/2017 234 150 - 379 x10E3/uL Final         Failed - WBC in normal range and within 360 days    WBC  Date Value Ref Range Status  09/13/2017 6.7 3.4 - 10.8 x10E3/uL Final         Passed - Valid encounter within last 12 months    Recent Outpatient Visits          2 months ago SOB (shortness of breath)   [facility] Volney American, PA-C   1 year ago PE (physical exam), annual   [facility] [place], Jeannette How, MD   1 year ago Recurrent major depressive disorder, in full remission [facility])   [facility] Crissman, Jeannette How, MD   2 years ago Annual physical exam   [facility] [name], MD   2 years ago Colon cancer screening   [facility] [place], Jeannette How, MD

## 2018-10-03 ENCOUNTER — Other Ambulatory Visit: Payer: Self-pay | Admitting: Family Medicine

## 2018-10-03 NOTE — Telephone Encounter (Signed)
Requested Prescriptions  Pending Prescriptions Disp Refills  . escitalopram (LEXAPRO) 10 MG tablet [Pharmacy Med Name: ESCITALOPRAM OXALATE 10 MG TAB] 90 tablet 0    Sig: TAKE 1 TABLET BY MOUTH DAILY     Psychiatry:  Antidepressants - SSRI Failed - 10/03/2018  2:39 PM      Failed - Completed PHQ-2 or PHQ-9 in the last 360 days.      Passed - Valid encounter within last 6 months    Recent Outpatient Visits          3 months ago SOB (shortness of breath)   [place], [place]   1 year ago PE (physical exam), annual   [facility] Crissman, Jeannette How, MD   1 year ago Recurrent major depressive disorder, in full remission [facility])   [facility] Crissman, Jeannette How, MD   2 years ago Annual physical exam   [facility] [name], MD   2 years ago Colon cancer screening   [facility] [name], MD

## 2018-10-10 ENCOUNTER — Telehealth: Payer: Self-pay | Admitting: Cardiovascular Disease

## 2018-10-10 ENCOUNTER — Telehealth (INDEPENDENT_AMBULATORY_CARE_PROVIDER_SITE_OTHER): Payer: PRIVATE HEALTH INSURANCE | Admitting: Physician Assistant

## 2018-10-10 ENCOUNTER — Encounter: Payer: Self-pay | Admitting: Physician Assistant

## 2018-10-10 ENCOUNTER — Other Ambulatory Visit: Payer: Self-pay

## 2018-10-10 ENCOUNTER — Other Ambulatory Visit: Payer: Self-pay | Admitting: Family Medicine

## 2018-10-10 VITALS — Ht 73.0 in | Wt 220.0 lb

## 2018-10-10 DIAGNOSIS — R0602 Shortness of breath: Secondary | ICD-10-CM

## 2018-10-10 DIAGNOSIS — R42 Dizziness and giddiness: Secondary | ICD-10-CM

## 2018-10-10 DIAGNOSIS — G4733 Obstructive sleep apnea (adult) (pediatric): Secondary | ICD-10-CM | POA: Diagnosis not present

## 2018-10-10 DIAGNOSIS — R0609 Other forms of dyspnea: Secondary | ICD-10-CM

## 2018-10-10 DIAGNOSIS — R002 Palpitations: Secondary | ICD-10-CM

## 2018-10-10 DIAGNOSIS — E663 Overweight: Secondary | ICD-10-CM

## 2018-10-10 DIAGNOSIS — R079 Chest pain, unspecified: Secondary | ICD-10-CM

## 2018-10-10 NOTE — Telephone Encounter (Signed)
Pt c/o Shortness Of Breath: STAT if SOB developed within the last 24 hours or pt is noticeably SOB on the phone  1. Are you currently SOB (can you hear that pt is SOB on the phone)? No   2. How long have you been experiencing SOB? 6-12 months increased recently   3. Are you SOB when sitting or when up moving around? Exertion only   4. Are you currently experiencing any other symptoms?  Palpitations lightheadedness sometimes   Last ov patient discussed with Gollan stress test if needed at some point.  Patient would like to do one.

## 2018-10-10 NOTE — Progress Notes (Signed)
Virtual Visit via Video Note   This visit type was conducted due to national recommendations for restrictions regarding the COVID-19 Pandemic (e.g. social distancing) in an effort to limit this patient's exposure and mitigate transmission in our community.  Due to his co-morbid illnesses, this patient is at least at moderate risk for complications without adequate follow up.  This format is felt to be most appropriate for this patient at this time.  All issues noted in this document were discussed and addressed.  A limited physical exam was performed with this format.  Please refer to the patient's chart for his consent to telehealth for [facility].   Date:  10/10/2018   ID:  Scott Chan, DOB 1965/09/26, MRN [id number]  Patient Location: Home Provider Location: Office  PCP:  [name], MD  Cardiologist:  Ida Rogue, MD  Electrophysiologist:  None   Evaluation Performed:  Follow-Up Visit  Chief Complaint:  Follow up SOB/palpitations   History of Present Illness:    Scott Chan is a 53 y.o. male with chest pain, near syncope, shortness of breath, OSA unable to afford CPAP, depression, and family history of CAD with possible MI in his brother in his 57s who presents for follow-up of shortness of breath and palpitations.  Patient has previously been evaluated by Dr. Rockey Situ for shortness of breath and intermittent chest pain.  Prior stress echo ordered for 06/2016 was not completed.  He underwent calcium scoring in 01/2018 which demonstrated a score of 9 which was the 64th percentile for age and sex matched control.  He was most recently seen in the office on 07/11/2018 noting no significant chest pain with continued intermittent lightheadedness.  Given the patient's atypical chest pain features and lack of risk factors aside from family history no further work-up of his chest pain/shortness of breath was warranted at that time.  The patient's lightheadedness was felt to  possibly be due to to low blood pressure/dehydration.  Patient called the office today noting continued intermittent 6 to 64-month history of shortness of breath that had increased recently as well as palpitations.  He was scheduled for a virtual visit to further discuss.  Patient presents for telehealth video visit.  He indicates on 5/23 he was using a self-propelled pushing a lawnmower to [place] his yard on relatively flat ground and was considerably short of breath with this along with associated fatigue and palpitations.  Typically, he indicates he can mow the lawn and be relatively asymptomatic.  He had to take a break between the backyard and front yard with this.  Later on in the day on 5/23 he went to his [place] to [place] the lawn and again noted significant shortness of breath and fatigue.  He indicated he was so tired he felt like he needed to "lay down in the yard."  There was no associated chest pain with either of these episodes.  Since then, he has done a fair amount of exertional work at the [employer] including using a chainsaw, pressure washing his house, and clearing brush without any symptoms.  He also reports feeling quite fatigued during his regular pickup basketball game in which he sometimes has to sit down and rest after participating for 2 to 3 minutes.  Lastly, he notes intermittent palpitations with associated dizziness.  He indicates his palpitations typically occur 15 to 20% of the time when he is climbing a flight of stairs at work.  Palpitations were reported with both of  his episodes of fatigue and shortness of breath on 5/23 though associated symptoms do not always accompany his palpitations.  Since his episodes on 5/23, he has felt well and is at baseline.  Labs: 08/2017 - TC 164, TG 234, HDL 37, LDL 80, TSH normal, serum creatinine 0.94, potassium 3.9, AST/ALT normal, Hgb 14.2  The patient does not have symptoms concerning for COVID-19 infection (fever, chills, cough, or new  shortness of breath).    Past Medical History:  Diagnosis Date   Depression    GERD (gastroesophageal reflux disease)    History of kidney stones    h/o   Hypotension    Orbital fracture 1986   Sleep apnea    no cpap   Past Surgical History:  Procedure Laterality Date   COLONOSCOPY WITH PROPOFOL N/A 09/28/2016   Procedure: COLONOSCOPY WITH PROPOFOL;  Surgeon: Lucilla Lame, MD;  Location: [facility] ENDOSCOPY;  Service: Endoscopy;  Laterality: N/A;   ESOPHAGOGASTRODUODENOSCOPY     nephrolithics  2003   NOSE SURGERY     TONSILLECTOMY  2007   TRANSURETHRAL RESECTION OF BLADDER TUMOR N/A 08/22/2018   Procedure: TRANSURETHRAL RESECTION OF BLADDER TUMOR (TURBT) BT;  Surgeon: Royston Cowper, MD;  Location: [facility] ORS;  Service: Urology;  Laterality: N/A;     Current Meds  Medication Sig   ANDROGEL PUMP 20.25 MG/ACT (1.62%) GEL Place 1 Pump onto the skin 2 (two) times a week.    escitalopram (LEXAPRO) 10 MG tablet TAKE 1 TABLET BY MOUTH DAILY   ibuprofen (ADVIL,MOTRIN) 200 MG tablet Take 800 mg by mouth every 6 (six) hours as needed for moderate pain.   lamoTRIgine (LAMICTAL) 100 MG tablet TAKE 1 TABLET BY MOUTH DAILY   Multiple Vitamin (MULTIVITAMIN) capsule Take 1 capsule by mouth daily.   omeprazole (PRILOSEC) 20 MG capsule Take 1 capsule (20 mg total) by mouth daily as needed. (Patient taking differently: Take 20 mg by mouth daily. )   [DISCONTINUED] ciprofloxacin (CIPRO) 500 MG tablet Take 1 tablet (500 mg total) by mouth 2 (two) times daily.     Allergies:   Patient has no known allergies.   Social History   Tobacco Use   Smoking status: Never Smoker   Smokeless tobacco: Never Used  Substance Use Topics   Alcohol use: Yes    Comment: occasional   Drug use: No     Family Hx: The patient's family history includes Heart attack in his brother; Hypertension in his brother and father; Ovarian cancer in his mother; Prostate cancer in his brother.  ROS:     Please see the history of present illness.     All other systems reviewed and are negative.   Prior CV studies:   The following studies were reviewed today:  Calcium score 01/2018: Coronary calcium score of 9. This was 66 percentile for age and sex matched control.  Labs/Other Tests and Data Reviewed:    EKG:  No ECG reviewed.  Recent Labs: No results found for requested labs within last 8760 hours.   Recent Lipid Panel Lab Results  Component Value Date/Time   CHOL 164 09/13/2017 04:24 PM   TRIG 234 (H) 09/13/2017 04:24 PM   HDL 37 (L) 09/13/2017 04:24 PM   CHOLHDL 4.4 09/13/2017 04:24 PM   LDLCALC 80 09/13/2017 04:24 PM    Wt Readings from Last 3 Encounters:  10/10/18 220 lb (99.8 kg)  08/22/18 215 lb (97.5 kg)  07/11/18 218 lb 8 oz (99.1 kg)  Objective:    Vital Signs:  Ht 6\' 1"  (1.854 m)    Wt 220 lb (99.8 kg)    BMI 29.03 kg/m    VITAL SIGNS:  reviewed GEN:  no acute distress EYES:  sclerae anicteric, EOMI - Extraocular Movements Intact  ASSESSMENT & PLAN:    1. Exertional dyspnea: Currently symptom-free.  Prior calcium score low as above.  He does not have any history of known diabetes, hypertension, or significant hyperlipidemia.  He has never been a smoker.  He is overweight and does have family history of premature CAD and his brother.  Schedule Lexiscan Myoview to evaluate for high risk ischemia.  If Carlton Adam is unrevealing would pursue echocardiogram.  2. Palpitations/dizziness: Schedule ZIO.  Recent CBC, thyroid function, and potassium unrevealing.  In the past he has had blood pressure running on the soft side therefore I will not start him on empiric AV nodal blocking agent at this time.  3. OSA: CPAP.  4. Overweight: Weight loss advised.  COVID-19 Education: The signs and symptoms of COVID-19 were discussed with the patient and how to seek care for testing (follow up with PCP or arrange E-visit).  The importance of social distancing was  discussed today.  Time:   Today, I have spent 21 minutes with the patient with telehealth technology discussing the above problems.     Medication Adjustments/Labs and Tests Ordered: Current medicines are reviewed at length with the patient today.  Concerns regarding medicines are outlined above.   Tests Ordered: No orders of the defined types were placed in this encounter.   Medication Changes: No orders of the defined types were placed in this encounter.   Disposition:  Follow up in 1 month(s)  Signed, Christell Faith, PA-C  10/10/2018 3:40 PM    [facility]

## 2018-10-10 NOTE — Telephone Encounter (Signed)
Spoke with patient. Last say Dr Rockey Situ earlier this year. Still been having symptoms. He has met his insurance deductibles and would like to proceed if any needed further testing such as stress test if advised. Suggested Virtual visit to discuss with provider and he is agreeable.      Virtual Visit Pre-Appointment Phone Call  "(Name), I am calling you today to discuss your upcoming appointment. We are currently trying to limit exposure to the virus that causes COVID-19 by seeing patients at home rather than in the office."  1. "What is the BEST phone number to call the day of the visit?" - include this in appointment notes  2. Do you have or have access to (through a family member/friend) a smartphone with video capability that we can use for your visit?" a. If yes - list this number in appt notes as cell (if different from BEST phone #) and list the appointment type as a VIDEO visit in appointment notes   3. Confirm consent - "In the setting of the current Covid19 crisis, you are scheduled for a (phone or video) visit with your provider on (date) at (time).  Just as we do with many in-office visits, in order for you to participate in this visit, we must obtain consent.  If you'd like, I can send this to your mychart (if signed up) or email for you to review.  Otherwise, I can obtain your verbal consent now.  All virtual visits are billed to your insurance company just like a normal visit would be.  By agreeing to a virtual visit, we'd like you to understand that the technology does not allow for your provider to perform an examination, and thus may limit your provider's ability to fully assess your condition. If your provider identifies any concerns that need to be evaluated in person, we will make arrangements to do so.  Finally, though the technology is pretty good, we cannot assure that it will always work on either your or our end, and in the setting of a video visit, we may have to convert it  to a phone-only visit.  In either situation, we cannot ensure that we have a secure connection.  Are you willing to proceed?" STAFF: Did the patient verbally acknowledge consent to telehealth visit? Document YES/NO here: yes  4. Advise patient to be prepared - "Two hours prior to your appointment, go ahead and check your blood pressure, pulse, oxygen saturation, and your weight (if you have the equipment to check those) and write them all down. When your visit starts, your provider will ask you for this information. If you have an Apple Watch or Kardia device, please plan to have heart rate information ready on the day of your appointment. Please have a pen and paper handy nearby the day of the visit as well."  Patient only has weight scale. No BP cuff.   5. Give patient instructions for MyChart download to smartphone OR [email or web address] as below if video visit (depending on what platform provider is using)  6. Inform patient they will receive a phone call 15 minutes prior to their appointment time (may be from unknown caller ID) so they should be prepared to answer    TELEPHONE CALL NOTE  Scott Chan has been deemed a candidate for a follow-up tele-health visit to limit community exposure during the Covid-19 pandemic. I spoke with the patient via phone to ensure availability of phone/video source, confirm preferred email & phone number, and discuss  instructions and expectations.  I reminded Scott Chan to be prepared with any vital sign and/or heart rhythm information that could potentially be obtained via home monitoring, at the time of his visit. I reminded Scott Chan to expect a phone call prior to his visit.  Roney Jaffe, RN 10/10/2018 11:17 AM

## 2018-10-11 ENCOUNTER — Telehealth: Payer: Self-pay | Admitting: Cardiovascular Disease

## 2018-10-11 NOTE — Telephone Encounter (Signed)
Patient calling Patient has an evisit with R. Dunn yesterday and was suggested to have a few tests Patient would like to clarify tests and see if they can be scheduled Please call to discuss

## 2018-10-11 NOTE — Addendum Note (Signed)
Addended by: Verlon Au on: 10/11/2018 02:20 PM   Modules accepted: Orders

## 2018-10-11 NOTE — Telephone Encounter (Signed)
Call to patient. Confirmed appt for lexi scan later this week. Reviewed all instructions verbally.  Reviewed Zio instructions as well.   Pt verbalized understanding. Virtual visit appt made for July 2020 with Dr. Rockey Situ.   Advised pt to call for any further questions or concerns

## 2018-10-11 NOTE — Patient Instructions (Addendum)
It was a pleasure to speak with you on the phone today! Thank you for allowing [place] to continue taking care of your [facility] needs during this time.   Feel free to call as needed for questions and concerns related to your cardiac needs.   Medication Instructions:  Your physician recommends that you continue on your current medications as directed. Please refer to the Current Medication list given to you today.  If you need a refill on your cardiac medications before your next appointment, please call your pharmacy.   Lab work: None ordered  If you have labs (blood work) drawn today and your tests are completely normal, you will receive your results only by: Marland Kitchen MyChart Message (if you have MyChart) OR . A paper copy in the mail If you have any lab test that is abnormal or we need to change your treatment, we will call you to review the results.  Testing/Procedures: 1- [place]  Your caregiver has ordered a Stress Test with nuclear imaging. The purpose of this test is to evaluate the blood supply to your heart muscle. This procedure is referred to as a "Non-Invasive Stress Test." This is because other than having an IV started in your vein, nothing is inserted or "invades" your body. Cardiac stress tests are done to find areas of poor blood flow to the heart by determining the extent of coronary artery disease (CAD). Some patients exercise on a treadmill, which naturally increases the blood flow to your heart, while others who are  unable to walk on a treadmill due to physical limitations have a pharmacologic/chemical stress agent called Lexiscan . This medicine will mimic walking on a treadmill by temporarily increasing your coronary blood flow.   Please note: these test may take anywhere between 2-4 hours to complete  PLEASE REPORT TO [facility] MEDICAL MALL ENTRANCE  THE VOLUNTEERS AT THE FIRST DESK WILL DIRECT YOU WHERE TO GO  Date of Procedure:_____________________________________  Arrival  Time for Procedure:______________________________  Instructions regarding medication:   _____:  Hold other medications as follows:____NO medications to hold____________________________________________________________________________________________________________  PLEASE NOTIFY THE OFFICE AT LEAST 24 HOURS IN ADVANCE IF YOU ARE UNABLE TO KEEP YOUR APPOINTMENT.  [phone] AND  PLEASE NOTIFY NUCLEAR MEDICINE AT [facility] AT LEAST 24 HOURS IN ADVANCE IF YOU ARE UNABLE TO KEEP YOUR APPOINTMENT. [phone]  How to prepare for your Myoview test:  1. Do not eat or drink after midnight 2. No caffeine for 24 hours prior to test 3. No smoking 24 hours prior to test. 4. Your medication may be taken with water.  If your doctor stopped a medication because of this test, do not take that medication. 5. Ladies, please do not wear dresses.  Skirts or pants are appropriate. Please wear a short sleeve shirt. 6. No perfume, cologne or lotion. 7. Wear comfortable walking shoes. No heels!   2-  Zio XT:  We will place order and you will receive it in the mail.  You may get a call from [employer] @ either  [phone] Or  [phone] for them to confirm your address before it will be sent to you.  You will wear the monitor for 14 days, remove it and send it back to the company. They will send [place] a report. Then we will call you with the results.   Follow-Up: At [facility], you and your health needs are our priority.  As part of our continuing mission to provide you with exceptional heart care, we have  created designated Provider Care Teams.  These Care Teams include your primary Cardiologist (physician) and Advanced Practice Providers (APPs -  Physician Assistants and Nurse Practitioners) who all work together to provide you with the care you need, when you need it. You will need a follow up appointment in 6 weeks. You may see Ida Rogue, MD or Christell Faith, PA-C.

## 2018-10-13 ENCOUNTER — Ambulatory Visit: Payer: PRIVATE HEALTH INSURANCE

## 2018-10-13 ENCOUNTER — Other Ambulatory Visit: Payer: PRIVATE HEALTH INSURANCE

## 2018-10-16 ENCOUNTER — Telehealth: Payer: Self-pay | Admitting: Cardiovascular Disease

## 2018-10-16 NOTE — Telephone Encounter (Signed)
Call to [facility] with zio suite. She reports that zio is in transmit.   Pt aware, will call us back if he does not receive later this week.

## 2018-10-16 NOTE — Telephone Encounter (Signed)
Patient calling to check on status of heart monitor - appointment was on 5/26 According to [facility] it appears it has been mailed and should receive this week Should contact us if not received by Wednesday

## 2018-10-17 ENCOUNTER — Ambulatory Visit (INDEPENDENT_AMBULATORY_CARE_PROVIDER_SITE_OTHER): Payer: PRIVATE HEALTH INSURANCE

## 2018-10-17 ENCOUNTER — Other Ambulatory Visit: Payer: Self-pay

## 2018-10-17 ENCOUNTER — Encounter
Admission: RE | Admit: 2018-10-17 | Discharge: 2018-10-17 | Disposition: A | Discharge status: Home / Self Care | Payer: PRIVATE HEALTH INSURANCE | Source: Ambulatory Visit | Attending: Physician Assistant | Admitting: Physician Assistant

## 2018-10-17 DIAGNOSIS — R002 Palpitations: Secondary | ICD-10-CM | POA: Diagnosis not present

## 2018-10-17 DIAGNOSIS — R0602 Shortness of breath: Secondary | ICD-10-CM | POA: Diagnosis present

## 2018-10-17 DIAGNOSIS — R079 Chest pain, unspecified: Secondary | ICD-10-CM | POA: Insufficient documentation

## 2018-10-17 LAB — NM MYOCAR MULTI W/SPECT W/WALL MOTION / EF
Estimated workload: 1 METS
Exercise duration (min): 0 min
Exercise duration (sec): 0 s
LV dias vol: 71 mL (ref 62–150)
LV sys vol: 24 mL
MPHR: 168 {beats}/min
Peak HR: 97 {beats}/min
Percent HR: 57 %
Rest HR: 75 {beats}/min
TID: 0.92

## 2018-10-17 MED ORDER — REGADENOSON 0.4 MG/5ML IV SOLN
0.4000 mg | Freq: Once | INTRAVENOUS | Status: AC
  Administered 2018-10-17: 0.4 mg via INTRAVENOUS

## 2018-10-17 MED ORDER — TECHNETIUM TC 99M TETROFOSMIN IV KIT
30.0000 | PACK | Freq: Once | INTRAVENOUS | Status: AC | PRN
  Administered 2018-10-17: 31.35 via INTRAVENOUS

## 2018-10-17 MED ORDER — TECHNETIUM TC 99M TETROFOSMIN IV KIT
10.0000 | PACK | Freq: Once | INTRAVENOUS | Status: AC | PRN
  Administered 2018-10-17: 9.68 via INTRAVENOUS

## 2018-11-08 ENCOUNTER — Other Ambulatory Visit: Payer: Self-pay

## 2018-11-20 NOTE — Progress Notes (Signed)
Virtual Visit via Video Note   This visit type was conducted due to national recommendations for restrictions regarding the COVID-19 Pandemic (e.g. social distancing) in an effort to limit this patient's exposure and mitigate transmission in our community.  Due to his co-morbid illnesses, this patient is at least at moderate risk for complications without adequate follow up.  This format is felt to be most appropriate for this patient at this time.  All issues noted in this document were discussed and addressed.  A limited physical exam was performed with this format.  Please refer to the patient's chart for his consent to telehealth for [facility].   I connected with  Scott Chan on 11/21/18 by a video enabled telemedicine application and verified that I am speaking with the correct person using two identifiers. I discussed the limitations of evaluation and management by telemedicine. The patient expressed understanding and agreed to proceed.   Evaluation Performed:  Follow-up visit  Date:  11/21/2018   ID:  Scott Chan, DOB 02-Jun-1965, MRN [id number]  Patient Location:  [address] [place] [address]   Provider location:   [facility], [place] office  PCP:  [name], MD  Cardiologist:  Patsy Baltimore   Chief Complaint:      History of Present Illness:    Scott Chan is a 53 y.o. male who presents via audio/video conferencing for a telehealth visit today.   The patient does not symptoms concerning for COVID-19 infection (fever, chills, cough, or new SHORTNESS OF BREATH).   Patient has a past medical history of Episodes of chest pain.  Near syncope SOB OSA, unable to afford CPAP athritis Depression Non smoker Fm hx of CAD Patient reports history of possible MI in brother in his 48s.  Stents in his father in his 67s.  Who presents today for follow-up of chest pain and shortness of breath   Event Monitor Normal  sinus rhythm Avg HR of 89 bpm.  Isolated SVEs were rare (<1.0%), SVE Couplets were rare (<1.0%), and SVE Triplets were rare (<1.0%). Isolated VEs were rare (<1.0%), and no VE Couplets or VE Triplets were present. Patient triggered events were not associated with significant arrhythmia. Legs weak  Stress test Showed no ischemia Minimal plaque in the aortic arch  Otherwise very active, mowing Prior history of orthostasis Reports blood pressure typically 562 systolic  Lab work reviewed in detail Total Chol 164/ LDL 80   CT coronary calcium score  01/2018 Coronary calcium score of 9. This was 2 percentile for age and sex matched control.  Stress test and echo previously ordered 07/08/2016, did not complete    Prior CV studies:   The following studies were reviewed today:    Past Medical History:  Diagnosis Date  . Depression   . GERD (gastroesophageal reflux disease)   . History of kidney stones    h/o  . Hypotension   . Orbital fracture 1986  . Sleep apnea    no cpap   Past Surgical History:  Procedure Laterality Date  . COLONOSCOPY WITH PROPOFOL N/A 09/28/2016   Procedure: COLONOSCOPY WITH PROPOFOL;  Surgeon: Lucilla Lame, MD;  Location: [facility] ENDOSCOPY;  Service: Endoscopy;  Laterality: N/A;  . ESOPHAGOGASTRODUODENOSCOPY    . nephrolithics  2003  . NOSE SURGERY    . TONSILLECTOMY  2007  . TRANSURETHRAL RESECTION OF BLADDER TUMOR N/A 08/22/2018   Procedure: TRANSURETHRAL RESECTION OF BLADDER TUMOR (TURBT) BT;  Surgeon: Royston Cowper, MD;  Location: [facility] ORS;  Service: Urology;  Laterality: N/A;     Current Meds  Medication Sig  . ANDROGEL PUMP 20.25 MG/ACT (1.62%) GEL Place 1 Pump onto the skin 2 (two) times a week.   . escitalopram (LEXAPRO) 10 MG tablet TAKE 1 TABLET BY MOUTH DAILY  . ibuprofen (ADVIL,MOTRIN) 200 MG tablet Take 800 mg by mouth every 6 (six) hours as needed for moderate pain.  Marland Kitchen lamoTRIgine (LAMICTAL) 100 MG tablet TAKE 1 TABLET BY MOUTH DAILY   . Multiple Vitamin (MULTIVITAMIN) capsule Take 1 capsule by mouth daily.  Marland Kitchen omeprazole (PRILOSEC) 20 MG capsule TAKE 1 CAPSULE BY MOUTH DAILY AS NEEDED     Allergies:   Patient has no known allergies.   Social History   Tobacco Use  . Smoking status: Never Smoker  . Smokeless tobacco: Never Used  Substance Use Topics  . Alcohol use: Yes    Comment: occasional  . Drug use: No     Current Outpatient Medications on File Prior to Visit  Medication Sig Dispense Refill  . ANDROGEL PUMP 20.25 MG/ACT (1.62%) GEL Place 1 Pump onto the skin 2 (two) times a week.     . escitalopram (LEXAPRO) 10 MG tablet TAKE 1 TABLET BY MOUTH DAILY 90 tablet 0  . ibuprofen (ADVIL,MOTRIN) 200 MG tablet Take 800 mg by mouth every 6 (six) hours as needed for moderate pain.    Marland Kitchen lamoTRIgine (LAMICTAL) 100 MG tablet TAKE 1 TABLET BY MOUTH DAILY 30 tablet 11  . Multiple Vitamin (MULTIVITAMIN) capsule Take 1 capsule by mouth daily.    Marland Kitchen omeprazole (PRILOSEC) 20 MG capsule TAKE 1 CAPSULE BY MOUTH DAILY AS NEEDED 90 capsule 0   No current facility-administered medications on file prior to visit.      Family Hx: The patient's family history includes Heart attack in his brother; Hypertension in his brother and father; Ovarian cancer in his mother; Prostate cancer in his brother.  ROS:   Please see the history of present illness.    ROS    Labs/Other Tests and Data Reviewed:    Recent Labs: No results found for requested labs within last 8760 hours.   Recent Lipid Panel Lab Results  Component Value Date/Time   CHOL 164 09/13/2017 04:24 PM   TRIG 234 (H) 09/13/2017 04:24 PM   HDL 37 (L) 09/13/2017 04:24 PM   CHOLHDL 4.4 09/13/2017 04:24 PM   LDLCALC 80 09/13/2017 04:24 PM    Wt Readings from Last 3 Encounters:  11/21/18 215 lb (97.5 kg)  10/10/18 220 lb (99.8 kg)  08/22/18 215 lb (97.5 kg)     Exam:    Vital Signs: Vital signs may also be detailed in the HPI Ht 6\' 1"  (1.854 m)   Wt 215 lb  (97.5 kg)   BMI 28.37 kg/m   Wt Readings from Last 3 Encounters:  11/21/18 215 lb (97.5 kg)  10/10/18 220 lb (99.8 kg)  08/22/18 215 lb (97.5 kg)   Temp Readings from Last 3 Encounters:  08/22/18 (!) 97.3 F (36.3 C) (Temporal)  07/04/18 98.1 F (36.7 C) (Oral)  05/03/17 97.7 F (36.5 C) (Oral)   BP Readings from Last 3 Encounters:  08/22/18 (!) 143/91  07/11/18 108/70  07/04/18 118/74   Pulse Readings from Last 3 Encounters:  08/22/18 80  07/11/18 84  07/04/18 89     120/70, 80, reap 8  Well nourished, well developed male in no acute distress. Constitutional:  oriented to person, place, and time. No distress.  Head: Normocephalic and atraumatic.  Eyes:  no discharge. No scleral icterus.  Neck: Normal range of motion. Neck supple.  Pulmonary/Chest: No audible wheezing, no distress, appears comfortable Musculoskeletal: Normal range of motion.  no  tenderness or deformity.  Neurological:   Coordination normal. Full exam not performed Skin:  No rash Psychiatric:  normal mood and affect. behavior is normal. Thought content normal.    ASSESSMENT & PLAN:    Problem List Items Addressed This Visit    Sleep apnea    Other Visit Diagnoses    Exertional dyspnea    -  Primary   Palpitations       Dizziness       OSA (obstructive sleep apnea)       Chest pain, unspecified type         Chest pain, shortness of breath We went through his previous CT coronary calcium score, recent stress test, event monitor, no significant abnormalities noted Prior history atypical chest pain Cholesterol is reasonable, non-smoker, nondiabetic Weight up 20 pounds, discussed diet with him  Lightheadedness possibly due to low Blood Pressure With recent 20 pound weight gain, low blood pressure has been less of an issue  SOB (shortness of breath) Recommended weight loss, exercise for conditioning  OSA (obstructive sleep apnea) Wears CPAP   Gastroesophageal Reflux Disease,  esophagitis presence not specified On PPI  COVID-19 Education: The signs and symptoms of COVID-19 were discussed with the patient and how to seek care for testing (follow up with PCP or arrange E-visit).  The importance of social distancing was discussed today.  Patient Risk:   After full review of this patients clinical status, I feel that they are at least moderate risk at this time.  Time:   Today, I have spent 25 minutes with the patient with telehealth technology discussing the cardiac and medical problems/diagnoses detailed above   10 min spent reviewing the chart prior to patient visit today   Medication Adjustments/Labs and Tests Ordered: Current medicines are reviewed at length with the patient today.  Concerns regarding medicines are outlined above.   Tests Ordered: No tests ordered   Medication Changes: No changes made   Disposition: Follow-up  as needed   Signed, Ida Rogue, MD  11/21/2018 10:25 AM    [place] Office [address], [place], [place] [address]

## 2018-11-21 ENCOUNTER — Other Ambulatory Visit: Payer: Self-pay

## 2018-11-21 ENCOUNTER — Telehealth (INDEPENDENT_AMBULATORY_CARE_PROVIDER_SITE_OTHER): Payer: PRIVATE HEALTH INSURANCE | Admitting: Cardiovascular Disease

## 2018-11-21 ENCOUNTER — Encounter: Payer: Self-pay | Admitting: Cardiovascular Disease

## 2018-11-21 VITALS — Ht 73.0 in | Wt 215.0 lb

## 2018-11-21 DIAGNOSIS — R0609 Other forms of dyspnea: Secondary | ICD-10-CM

## 2018-11-21 DIAGNOSIS — R42 Dizziness and giddiness: Secondary | ICD-10-CM

## 2018-11-21 DIAGNOSIS — G4730 Sleep apnea, unspecified: Secondary | ICD-10-CM

## 2018-11-21 DIAGNOSIS — G4733 Obstructive sleep apnea (adult) (pediatric): Secondary | ICD-10-CM | POA: Diagnosis not present

## 2018-11-21 DIAGNOSIS — K219 Gastro-esophageal reflux disease without esophagitis: Secondary | ICD-10-CM

## 2018-11-21 DIAGNOSIS — R002 Palpitations: Secondary | ICD-10-CM

## 2018-11-21 DIAGNOSIS — R079 Chest pain, unspecified: Secondary | ICD-10-CM

## 2018-11-21 NOTE — Patient Instructions (Signed)

## 2018-12-25 NOTE — H&P (Signed)
NAME: Scott Chan, Scott Chan MEDICAL RECORD [id number] ACCOUNT [id number] DATE OF BIRTH:1965/12/30 FACILITY: [facility] LOCATION:  PHYSICIAN:Shernita Rabinovich Farrel Conners, MD  HISTORY AND PHYSICAL  DATE OF ADMISSION:  01/02/2019  CHIEF COMPLAINT:  Bladder tumor.  HISTORY OF PRESENT ILLNESS:  The patient is a 53 year old white male with history of BPH and lower urinary tract symptoms as well as a superficial bladder cancer.  He had a papillary tumor resected in April of this year.  He underwent surveillance  cystoscopy in July and had a 7 x 7 mm tumor in the dome of his bladder.  He comes in now for resection of this bladder tumor.  ALLERGIES:  No drug allergies.  CURRENT MEDICATIONS:  Omeprazole, Abilify, Lexapro and multivitamin.  PAST SURGICAL HISTORY:  Repair of fractured nose in 1986.  Colonoscopy 2018.  Tonsillectomy in 2010.  PAST AND CURRENT MEDICAL HISTORY: 1.  GERD. 2.  BPH. 3.  Hypogonadism.  REVIEW OF SYSTEMS:  The patient denies chest pain, diabetes, stroke, heart disease or shortness of breath.  SOCIAL HISTORY:  The patient denied tobacco use.  Consumes 2 alcoholic beverages per week.  FAMILY HISTORY:  Negative for bladder cancer or urological disease.  PHYSICAL EXAMINATION: VITAL SIGNS:  Height was 6 feet 0 inches, weight 214 pounds. GENERAL:  A well-nourished, white male in no acute distress. HEENT:  Sclerae were clear.  Pupils are equally round, reactive to light and accommodation. NECK:  No palpable cervical adenopathy. PULMONARY:  Lungs clear to auscultation. CARDIOVASCULAR:  Regular rhythm and rate without audible murmurs. ABDOMEN:  Soft, nontender abdomen. GENITOURINARY:  The patient was circumcised.  Testes were smooth, nontender, but atrophic approximately 16 mL in size each. RECTAL:  A 50 gram smooth, nontender prostate. NEUROMUSCULAR:  Alert and oriented x3.  IMPRESSION:  Superficial bladder cancer.  PLAN:  Transurethral resection of bladder tumor.  AN/NUANCE   D:12/25/2018 T:12/25/2018 JOB:[id number]

## 2018-12-26 ENCOUNTER — Other Ambulatory Visit: Payer: Self-pay | Admitting: Family Medicine

## 2018-12-26 NOTE — Telephone Encounter (Signed)
Requested Prescriptions  Pending Prescriptions Disp Refills  . omeprazole (PRILOSEC) 20 MG capsule [Pharmacy Med Name: OMEPRAZOLE DR 20 MG CAP] 90 capsule 0    Sig: TAKE ONE CAPSULE DAILY AS NEEDED     Gastroenterology: Proton Pump Inhibitors Passed - 12/26/2018 10:15 AM      Passed - Valid encounter within last 12 months    Recent Outpatient Visits          5 months ago SOB (shortness of breath)   [place], [place]   1 year ago PE (physical exam), annual   [facility] Cr[place], Jeannette How, MD   1 year ago Recurrent major depressive disorder, in full remission [facility])   [facility] Crissman, Jeannette How, MD   2 years ago Annual physical exam   [facility] [name], MD   2 years ago Colon cancer screening   [facility] [name], MD

## 2018-12-28 ENCOUNTER — Encounter
Admission: RE | Admit: 2018-12-28 | Discharge: 2018-12-28 | Disposition: A | Discharge status: Home / Self Care | Payer: PRIVATE HEALTH INSURANCE | Source: Ambulatory Visit | Attending: Urology | Admitting: Urology

## 2018-12-28 ENCOUNTER — Other Ambulatory Visit: Payer: Self-pay

## 2018-12-28 NOTE — Patient Instructions (Addendum)
Your procedure is scheduled on: 01/02/2019 Tues Report to Same Day Surgery 2nd floor medical mall [facility] Entrance-take elevator on left to 2nd floor.  Check in with surgery information desk.) To find out your arrival time please call [phone] between 1PM - 3PM on 01/01/2019 Mon  Remember: Instructions that are not followed completely may result in serious medical risk, up to and including death, or upon the discretion of your surgeon and anesthesiologist your surgery may need to be rescheduled.    _x___ 1. Do not eat food after midnight the night before your procedure. You may drink clear liquids up to 2 hours before you are scheduled to arrive at the hospital for your procedure.  Do not drink clear liquids within 2 hours of your scheduled arrival to the hospital.  Clear liquids include  --Water or Apple juice without pulp  --Clear carbohydrate beverage such as ClearFast or Gatorade  --Black Coffee or Clear Tea (No milk, no creamers, do not add anything to                  the coffee or Tea Type 1 and type 2 diabetics should only drink water.   ____Ensure clear carbohydrate drink on the way to the hospital for bariatric patients  ____Ensure clear carbohydrate drink 3 hours before surgery.   No gum chewing or hard candies.     __x__ 2. No Alcohol for 24 hours before or after surgery.   __x__3. No Smoking or e-cigarettes for 24 prior to surgery.  Do not use any chewable tobacco products for at least 6 hour prior to surgery   ____  4. Bring all medications with you on the day of surgery if instructed.    __x__ 5. Notify your doctor if there is any change in your medical condition     (cold, fever, infections).    x___6. On the morning of surgery brush your teeth with toothpaste and water.  You may rinse your mouth with mouth wash if you wish.  Do not swallow any toothpaste or mouthwash.   Do not wear jewelry, make-up, hairpins, clips or nail polish.  Do not wear lotions,  powders, or perfumes. You may wear deodorant.  Do not shave 48 hours prior to surgery. Men may shave face and neck.  Do not bring valuables to the hospital.    [facility] is not responsible for any belongings or valuables.               Contacts, dentures or bridgework may not be worn into surgery.  Leave your suitcase in the car. After surgery it may be brought to your room.  For patients admitted to the hospital, discharge time is determined by your                       treatment team.  _  Patients discharged the day of surgery will not be allowed to drive home.  You will need someone to drive you home and stay with you the night of your procedure.    Please read over the following fact sheets that you were given:   [facility] Preparing for Surgery and or MRSA Information   _x___ Take anti-hypertensive listed below, cardiac, seizure, asthma,     anti-reflux and psychiatric medicines. These include:  1. escitalopram (LEXAPRO) 10 MG tablet  2.lamoTRIgine (LAMICTAL) 100 MG tablet  3.omeprazole (PRILOSEC) 20 MG capsule  4.  5.  6.  ____Fleets enema  or Magnesium Citrate as directed.   ____ Use CHG Soap or sage wipes as directed on instruction sheet   ____ Use inhalers on the day of surgery and bring to hospital day of surgery  ____ Stop Metformin and Janumet 2 days prior to surgery.    ____ Take 1/2 of usual insulin dose the night before surgery and none on the morning     surgery.   _x___ Follow recommendations from Cardiologist, Pulmonologist or PCP regarding          stopping Aspirin, Coumadin, Plavix ,Eliquis, Effient, or Pradaxa, and Pletal.  X____Stop Anti-inflammatories such as Advil, Aleve, Ibuprofen, Motrin, Naproxen, Naprosyn, Goodies powders or aspirin products. OK to take Tylenol and                          Celebrex.   _x___ Stop supplements until after surgery.  But may continue Vitamin D, Vitamin B,       and multivitamin.   __x__ Bring C-Pap to the hospital.

## 2018-12-29 ENCOUNTER — Other Ambulatory Visit
Admission: RE | Admit: 2018-12-29 | Discharge: 2018-12-29 | Disposition: A | Discharge status: Home / Self Care | Payer: PRIVATE HEALTH INSURANCE | Source: Ambulatory Visit | Attending: Physical Medicine and Rehabilitation | Admitting: Physical Medicine and Rehabilitation

## 2018-12-29 ENCOUNTER — Other Ambulatory Visit: Payer: Self-pay

## 2018-12-29 DIAGNOSIS — Z01812 Encounter for preprocedural laboratory examination: Secondary | ICD-10-CM | POA: Insufficient documentation

## 2018-12-29 DIAGNOSIS — Z20828 Contact with and (suspected) exposure to other viral communicable diseases: Secondary | ICD-10-CM | POA: Insufficient documentation

## 2018-12-29 LAB — SARS CORONAVIRUS 2 (TAT 6-24 HRS): SARS Coronavirus 2: NEGATIVE

## 2019-01-02 ENCOUNTER — Encounter
Admission: RE | Disposition: A | Discharge status: Home / Self Care | Payer: Self-pay | Source: Home / Self Care | Attending: Urology

## 2019-01-02 ENCOUNTER — Other Ambulatory Visit: Payer: Self-pay

## 2019-01-02 ENCOUNTER — Encounter: Payer: Self-pay | Admitting: Emergency Medicine

## 2019-01-02 ENCOUNTER — Ambulatory Visit: Payer: PRIVATE HEALTH INSURANCE | Admitting: Anesthesiology

## 2019-01-02 ENCOUNTER — Ambulatory Visit
Admission: RE | Admit: 2019-01-02 | Discharge: 2019-01-02 | Disposition: A | Discharge status: Home / Self Care | Payer: PRIVATE HEALTH INSURANCE | Attending: Urology | Admitting: Urology

## 2019-01-02 DIAGNOSIS — F329 Major depressive disorder, single episode, unspecified: Secondary | ICD-10-CM | POA: Insufficient documentation

## 2019-01-02 DIAGNOSIS — Z79899 Other long term (current) drug therapy: Secondary | ICD-10-CM | POA: Insufficient documentation

## 2019-01-02 DIAGNOSIS — C679 Malignant neoplasm of bladder, unspecified: Secondary | ICD-10-CM | POA: Insufficient documentation

## 2019-01-02 DIAGNOSIS — G4730 Sleep apnea, unspecified: Secondary | ICD-10-CM | POA: Diagnosis not present

## 2019-01-02 DIAGNOSIS — K219 Gastro-esophageal reflux disease without esophagitis: Secondary | ICD-10-CM | POA: Insufficient documentation

## 2019-01-02 DIAGNOSIS — D494 Neoplasm of unspecified behavior of bladder: Secondary | ICD-10-CM | POA: Diagnosis present

## 2019-01-02 HISTORY — PX: TRANSURETHRAL RESECTION OF BLADDER TUMOR: SHX2575

## 2019-01-02 SURGERY — TURBT (TRANSURETHRAL RESECTION OF BLADDER TUMOR)
Anesthesia: General

## 2019-01-02 MED ORDER — OXYCODONE-ACETAMINOPHEN 10-325 MG PO TABS: 1.0000 | ORAL_TABLET | ORAL | 0 refills | Status: DC | PRN

## 2019-01-02 MED ORDER — CEFAZOLIN SODIUM-DEXTROSE 1-4 GM/50ML-% IV SOLN
INTRAVENOUS | Status: AC
  Filled 2019-01-02: qty 50

## 2019-01-02 MED ORDER — OXYCODONE HCL 5 MG PO TABS: 5.0000 mg | ORAL_TABLET | Freq: Once | ORAL | Status: DC | PRN

## 2019-01-02 MED ORDER — LIDOCAINE HCL URETHRAL/MUCOSAL 2 % EX GEL
CUTANEOUS | Status: AC
  Filled 2019-01-02: qty 10

## 2019-01-02 MED ORDER — CEFAZOLIN SODIUM-DEXTROSE 1-4 GM/50ML-% IV SOLN
INTRAVENOUS | Status: DC | PRN
  Administered 2019-01-02: 2 g via INTRAVENOUS

## 2019-01-02 MED ORDER — MIDAZOLAM HCL 5 MG/5ML IJ SOLN
INTRAMUSCULAR | Status: DC | PRN
  Administered 2019-01-02: 2 mg via INTRAVENOUS

## 2019-01-02 MED ORDER — ONDANSETRON HCL 4 MG/2ML IJ SOLN
INTRAMUSCULAR | Status: AC
  Filled 2019-01-02: qty 2

## 2019-01-02 MED ORDER — GLYCOPYRROLATE 0.2 MG/ML IJ SOLN
INTRAMUSCULAR | Status: AC
  Filled 2019-01-02: qty 1

## 2019-01-02 MED ORDER — CEFAZOLIN SODIUM-DEXTROSE 1-4 GM/50ML-% IV SOLN: 1.0000 g | Freq: Once | INTRAVENOUS | Status: DC

## 2019-01-02 MED ORDER — LIDOCAINE HCL URETHRAL/MUCOSAL 2 % EX GEL
CUTANEOUS | Status: DC | PRN
  Administered 2019-01-02: 1

## 2019-01-02 MED ORDER — FENTANYL CITRATE (PF) 100 MCG/2ML IJ SOLN
25.0000 ug | INTRAMUSCULAR | Status: DC | PRN
  Administered 2019-01-02 (×2): 25 ug via INTRAVENOUS

## 2019-01-02 MED ORDER — FENTANYL CITRATE (PF) 100 MCG/2ML IJ SOLN
INTRAMUSCULAR | Status: AC
  Administered 2019-01-02: 15:00:00 25 ug via INTRAVENOUS
  Filled 2019-01-02: qty 2

## 2019-01-02 MED ORDER — BELLADONNA ALKALOIDS-OPIUM 16.2-60 MG RE SUPP
RECTAL | Status: AC
  Filled 2019-01-02: qty 1

## 2019-01-02 MED ORDER — ONDANSETRON HCL 4 MG/2ML IJ SOLN
INTRAMUSCULAR | Status: DC | PRN
  Administered 2019-01-02: 4 mg via INTRAVENOUS

## 2019-01-02 MED ORDER — DEXAMETHASONE SODIUM PHOSPHATE 10 MG/ML IJ SOLN
INTRAMUSCULAR | Status: AC
  Filled 2019-01-02: qty 1

## 2019-01-02 MED ORDER — MIDAZOLAM HCL 2 MG/2ML IJ SOLN
INTRAMUSCULAR | Status: AC
  Filled 2019-01-02: qty 2

## 2019-01-02 MED ORDER — BELLADONNA ALKALOIDS-OPIUM 16.2-30 MG RE SUPP
RECTAL | Status: DC | PRN
  Administered 2019-01-02: 60 mg via RECTAL

## 2019-01-02 MED ORDER — FENTANYL CITRATE (PF) 100 MCG/2ML IJ SOLN
INTRAMUSCULAR | Status: DC | PRN
  Administered 2019-01-02: 100 ug via INTRAVENOUS

## 2019-01-02 MED ORDER — FENTANYL CITRATE (PF) 100 MCG/2ML IJ SOLN
INTRAMUSCULAR | Status: AC
  Filled 2019-01-02: qty 2

## 2019-01-02 MED ORDER — URIBEL 118 MG PO CAPS: 1.0000 | ORAL_CAPSULE | Freq: Four times a day (QID) | ORAL | 3 refills | Status: DC | PRN

## 2019-01-02 MED ORDER — PHENYLEPHRINE 40 MCG/ML (10ML) SYRINGE FOR IV PUSH (FOR BLOOD PRESSURE SUPPORT)
PREFILLED_SYRINGE | INTRAVENOUS | Status: DC | PRN
  Administered 2019-01-02 (×2): 100 ug via INTRAVENOUS

## 2019-01-02 MED ORDER — PROPOFOL 500 MG/50ML IV EMUL
INTRAVENOUS | Status: AC
  Filled 2019-01-02: qty 50

## 2019-01-02 MED ORDER — LIDOCAINE 2% (20 MG/ML) 5 ML SYRINGE
INTRAMUSCULAR | Status: DC | PRN
  Administered 2019-01-02: 100 mg via INTRAVENOUS

## 2019-01-02 MED ORDER — DEXMEDETOMIDINE HCL 200 MCG/2ML IV SOLN
INTRAVENOUS | Status: DC | PRN
  Administered 2019-01-02: 12 ug via INTRAVENOUS

## 2019-01-02 MED ORDER — GLYCOPYRROLATE 0.2 MG/ML IJ SOLN
INTRAMUSCULAR | Status: DC | PRN
  Administered 2019-01-02: 0.2 mg via INTRAVENOUS

## 2019-01-02 MED ORDER — OXYCODONE HCL 5 MG/5ML PO SOLN: 5.0000 mg | Freq: Once | ORAL | Status: DC | PRN

## 2019-01-02 MED ORDER — LACTATED RINGERS IV SOLN
INTRAVENOUS | Status: DC
  Administered 2019-01-02: 12:00:00 via INTRAVENOUS

## 2019-01-02 MED ORDER — EPHEDRINE SULFATE 50 MG/ML IJ SOLN
INTRAMUSCULAR | Status: DC | PRN
  Administered 2019-01-02: 10 mg via INTRAVENOUS

## 2019-01-02 MED ORDER — PROPOFOL 10 MG/ML IV BOLUS
INTRAVENOUS | Status: DC | PRN
  Administered 2019-01-02: 50 mg via INTRAVENOUS
  Administered 2019-01-02: 250 mg via INTRAVENOUS

## 2019-01-02 SURGICAL SUPPLY — 20 items
BAG DRAIN CYSTO-URO LG1000N (MISCELLANEOUS) ×3 IMPLANT
BAG URINE DRAINAGE (UROLOGICAL SUPPLIES) ×3 IMPLANT
CATH FOLEY 2WAY  5CC 20FR SIL (CATHETERS) ×2
CATH FOLEY 2WAY 5CC 20FR SIL (CATHETERS) ×1 IMPLANT
DRSG TELFA 4X3 1S NADH ST (GAUZE/BANDAGES/DRESSINGS) ×3 IMPLANT
ELECT LOOP 22F BIPOLAR SML (ELECTROSURGICAL) ×3
ELECTRODE LOOP 22F BIPOLAR SML (ELECTROSURGICAL) IMPLANT
GLOVE BIO SURGEON STRL SZ7.5 (GLOVE) ×3 IMPLANT
GOWN STRL REUS W/ TWL LRG LVL4 (GOWN DISPOSABLE) ×1 IMPLANT
GOWN STRL REUS W/ TWL XL LVL3 (GOWN DISPOSABLE) ×1 IMPLANT
GOWN STRL REUS W/TWL LRG LVL4 (GOWN DISPOSABLE) ×2
GOWN STRL REUS W/TWL XL LVL3 (GOWN DISPOSABLE) ×2
KIT TURNOVER CYSTO (KITS) ×3 IMPLANT
LOOP CUT BIPOLAR 24F LRG (ELECTROSURGICAL) ×1 IMPLANT
PACK CYSTO AR (MISCELLANEOUS) ×3 IMPLANT
SET IRRIG Y TYPE TUR BLADDER L (SET/KITS/TRAYS/PACK) ×6 IMPLANT
SOL .9 NS 3000ML IRR  AL (IV SOLUTION) ×8
SOL .9 NS 3000ML IRR UROMATIC (IV SOLUTION) ×4 IMPLANT
SYRINGE IRR TOOMEY STRL 70CC (SYRINGE) ×3 IMPLANT
WATER STERILE IRR 1000ML POUR (IV SOLUTION) ×3 IMPLANT

## 2019-01-02 NOTE — Transfer of Care (Signed)
Immediate Anesthesia Transfer of Care Note  Patient: Scott Chan  Procedure(s) Performed: TRANSURETHRAL RESECTION OF BLADDER TUMOR (TURBT) (N/A )  Patient Location: PACU  Anesthesia Type:General  Level of Consciousness: awake, alert  and oriented  Airway & Oxygen Therapy: Patient Spontanous Breathing and Patient connected to face mask oxygen  Post-op Assessment: Report given to RN and Post -op Vital signs reviewed and stable  Post vital signs: Reviewed and stable  Last Vitals:  Vitals Value Taken Time  BP 134/86 01/02/19 1410  Temp 36.9 C 01/02/19 1410  Pulse 89 01/02/19 1413  Resp 7 01/02/19 1413  SpO2 100 % 01/02/19 1413  Vitals shown include unvalidated device data.  Last Pain:  Vitals:   01/02/19 1410  TempSrc:   PainSc: Asleep         Complications: No apparent anesthesia complications

## 2019-01-02 NOTE — Discharge Instructions (Addendum)
Transurethral Resection of Bladder Tumor, Care After This sheet gives you information about how to care for yourself after your procedure. Your health care provider may also give you more specific instructions. If you have problems or questions, contact your health care provider. What can I expect after the procedure? After the procedure, it is common to have:  A small amount of blood in your urine for up to 2 weeks.  Soreness or mild pain from your catheter. After your catheter is removed, you may have mild soreness, especially when urinating.  Pain in your lower abdomen. Follow these instructions at home: Medicines   Take over-the-counter and prescription medicines only as told by your health care provider.  If you were prescribed an antibiotic medicine, take it as told by your health care provider. Do not stop taking the antibiotic even if you start to feel better.  Do not drive for 24 hours if you were given a sedative during your procedure.  Ask your health care provider if the medicine prescribed to you: ? Requires you to avoid driving or using heavy machinery. ? Can cause constipation. You may need to take these actions to prevent or treat constipation:  Take over-the-counter or prescription medicines.  Eat foods that are high in fiber, such as beans, whole grains, and fresh fruits and vegetables.  Limit foods that are high in fat and processed sugars, such as fried or sweet foods. Activity  Return to your normal activities as told by your health care provider. Ask your health care provider what activities are safe for you.  Do not lift anything that is heavier than 10 lb (4.5 kg), or the limit that you are told, until your health care provider says that it is safe.  Avoid intense physical activity for as long as told by your health care provider.  Rest as told by your health care provider.  Avoid sitting for a long time without moving. Get up to take short walks every  1-2 hours. This is important to improve blood flow and breathing. Ask for help if you feel weak or unsteady. General instructions   Do not drink alcohol for as long as told by your health care provider. This is especially important if you are taking prescription pain medicines.  Do not take baths, swim, or use a hot tub until your health care provider approves. Ask your health care provider if you may take showers. You may only be allowed to take sponge baths.  If you have a catheter, follow instructions from your health care provider about caring for your catheter and your drainage bag.  Drink enough fluid to keep your urine pale yellow.  Wear compression stockings as told by your health care provider. These stockings help to prevent blood clots and reduce swelling in your legs.  Keep all follow-up visits as told by your health care provider. This is important. ? You will need to be followed closely with regular checks of your bladder and urethra (cystoscopies) to make sure that the cancer does not come back. Contact a health care provider if:  You have pain that gets worse or does not improve with medicine.  You have blood in your urine for more than 2 weeks.  You have cloudy or bad-smelling urine.  You become constipated. Signs of constipation may include having: ? Fewer than three bowel movements in a week. ? Difficulty having a bowel movement. ? Stools that are dry, hard, or larger than normal.  You have  a fever. Get help right away if:  You have: ? Severe pain. ? Bright red blood in your urine. ? Blood clots in your urine. ? A lot of blood in your urine.  Your catheter has been removed and you are not able to urinate.  You have a catheter in place and the catheter is not draining urine. Summary  After your procedure, it is common to have a small amount of blood in your urine, soreness or mild pain from your catheter, and pain in your lower abdomen.  Take  over-the-counter and prescription medicines only as told by your health care provider.  Rest as told by your health care provider. Follow your health care provider's instructions about returning to normal activities. Ask what activities are safe for you.  If you have a catheter, follow instructions from your health care provider about caring for your catheter and your drainage bag.  Get help right away if you cannot urinate, you have severe pain, or you have bright red blood or blood clots in your urine. This information is not intended to replace advice given to you by your health care provider. Make sure you discuss any questions you have with your health care provider. Document Released: 04/14/2015 Document Revised: 12/01/2017 Document Reviewed: 12/01/2017 Elsevier Patient Education  2020 [employer]   1) The drugs that you were given will stay in your system until tomorrow so for the next 24 hours you should not:  A) Drive an automobile B) Make any legal decisions C) Drink any alcoholic beverage   2) You may resume regular meals tomorrow.  Today it is better to start with liquids and gradually work up to solid foods.  You may eat anything you prefer, but it is better to start with liquids, then soup and crackers, and gradually work up to solid foods.   3) Please notify your doctor immediately if you have any unusual bleeding, trouble breathing, redness and pain at the surgery site, drainage, fever, or pain not relieved by medication.    4) Additional Instructions:        Please contact your physician with any problems or Same Day Surgery at [phone], Monday through Friday 6 am to 4 pm, or [place] at [facility] number at [phone].

## 2019-01-02 NOTE — Anesthesia Post-op Follow-up Note (Signed)
Anesthesia QCDR form completed.        

## 2019-01-02 NOTE — H&P (Signed)
Date of Initial H&P: 12/25/18  History reviewed, patient examined, no change in status, stable for surgery.

## 2019-01-02 NOTE — Anesthesia Procedure Notes (Signed)
Procedure Name: LMA Insertion Date/Time: 01/02/2019 1:31 PM Performed by: Marsh Dolly, CRNA Pre-anesthesia Checklist: Patient identified, Patient being monitored, Timeout performed, Emergency Drugs available and Suction available Patient Re-evaluated:Patient Re-evaluated prior to induction Oxygen Delivery Method: Circle system utilized Preoxygenation: Pre-oxygenation with 100% oxygen Induction Type: IV induction Ventilation: Mask ventilation without difficulty LMA: LMA inserted LMA Size: 5.0 Tube type: Oral Number of attempts: 1 Placement Confirmation: positive ETCO2 and breath sounds checked- equal and bilateral Tube secured with: Tape Dental Injury: Teeth and Oropharynx as per pre-operative assessment

## 2019-01-02 NOTE — Op Note (Signed)
Preoperative diagnosis: Bladder tumor  Postoperative diagnosis: Same  Procedure: Transurethral resection of bladder tumor  Surgeon: Otelia Limes. Yves Dill MD  Anesthesia: General  Indications:See the history and physical. After informed consent the above procedure(s) were requested     Technique and findings: After adequate general anesthesia had been obtained the patient was placed into dorsal lithotomy position and the perineum was prepped and draped in the usual fashion.  Urethra was dilated up to 1 [place] with the dilators.  The 24 French resectoscope sheath was then advanced into the bladder with the obturator in place.  The resectoscope was coupled to the camera and then placed into the sheath.  The bladder was then thoroughly inspected.  Both ureteral orifices were identified and had clear efflux.  A 10 mm papillary bladder tumor was identified near the anterior bladder proximal to the bladder neck.  Using the loop electrode the tumor was resected and specimen sent to pathology.  Base of the tumor was cauterized.  The bladder was drained and resectoscope was removed.  10 cc of viscous Xylocaine was instilled within the urethra.  A B&O suppository was placed.  Blood loss was minimal.  The procedure was terminated and patient transferred to the recovery room in stable condition.

## 2019-01-02 NOTE — Anesthesia Postprocedure Evaluation (Signed)
Anesthesia Post Note  Patient: Scott Chan  Procedure(s) Performed: TRANSURETHRAL RESECTION OF BLADDER TUMOR (TURBT) (N/A )  Patient location during evaluation: PACU Anesthesia Type: General Level of consciousness: awake and alert Pain management: pain level controlled Vital Signs Assessment: post-procedure vital signs reviewed and stable Respiratory status: spontaneous breathing, nonlabored ventilation, respiratory function stable and patient connected to nasal cannula oxygen Cardiovascular status: blood pressure returned to baseline and stable Postop Assessment: no apparent nausea or vomiting Anesthetic complications: no     Last Vitals:  Vitals:   01/02/19 1509 01/02/19 1552  BP: 117/74 111/81  Pulse: 83 88  Resp: 18 18  Temp: (!) 36.1 C   SpO2: 99% 99%    Last Pain:  Vitals:   01/02/19 1552  TempSrc:   PainSc: [place]

## 2019-01-02 NOTE — Anesthesia Preprocedure Evaluation (Signed)
Anesthesia Evaluation  Patient identified by MRN, date of birth, ID band Patient awake    Reviewed: Allergy & Precautions, H&P , NPO status , Patient's Chart, lab work & pertinent test results  History of Anesthesia Complications Negative for: history of anesthetic complications  Airway Mallampati: II  TM Distance: >3 FB Neck ROM: full    Dental  (+) Teeth Intact   Pulmonary sleep apnea , neg COPD,           Cardiovascular (-) angina(-) Past MI and (-) Cardiac Stents negative cardio ROS  (-) dysrhythmias      Neuro/Psych PSYCHIATRIC DISORDERS Depression negative neurological ROS     GI/Hepatic Neg liver ROS, GERD  Controlled and Medicated,  Endo/Other  negative endocrine ROS  Renal/GU      Musculoskeletal   Abdominal   Peds  Hematology negative hematology ROS (+)   Anesthesia Other Findings Past Medical History: No date: Depression No date: GERD (gastroesophageal reflux disease) No date: History of kidney stones     Comment:  h/o No date: Hypotension 1986: Orbital fracture No date: Sleep apnea     Comment:  no cpap  Past Surgical History: 09/28/2016: COLONOSCOPY WITH PROPOFOL; N/A     Comment:  Procedure: COLONOSCOPY WITH PROPOFOL;  Surgeon: Lucilla Lame, MD;  Location: [facility];  Service:               Endoscopy;  Laterality: N/A; No date: ESOPHAGOGASTRODUODENOSCOPY 2003: nephrolithics No date: NOSE SURGERY 2007: TONSILLECTOMY 08/22/2018: TRANSURETHRAL RESECTION OF BLADDER TUMOR; N/A     Comment:  Procedure: TRANSURETHRAL RESECTION OF BLADDER TUMOR               (TURBT) BT;  Surgeon: Royston Cowper, MD;  Location:               [facility] OR[facility];  Service: Urology;  Laterality: N/A;  BMI    Body Mass Index: 29.03 kg/m      Reproductive/Obstetrics negative OB ROS                             Anesthesia Physical Anesthesia Plan  ASA: II  Anesthesia Plan:  General LMA   Post-op Pain Management:    Induction:   PONV Risk Score and Plan: Dexamethasone, Ondansetron, Midazolam and Treatment may vary due to age or medical condition  Airway Management Planned:   Additional Equipment:   Intra-op Plan:   Post-operative Plan:   Informed Consent: I have reviewed the patients History and Physical, chart, labs and discussed the procedure including the risks, benefits and alternatives for the proposed anesthesia with the patient or authorized representative who has indicated his/her understanding and acceptance.     Dental Advisory Given  Plan Discussed with: Anesthesiologist and CRNA  Anesthesia Plan Comments:         Anesthesia Quick Evaluation

## 2019-01-04 LAB — SURGICAL PATHOLOGY

## 2019-01-12 ENCOUNTER — Other Ambulatory Visit: Payer: Self-pay | Admitting: Urology

## 2019-01-12 DIAGNOSIS — C673 Malignant neoplasm of anterior wall of bladder: Secondary | ICD-10-CM

## 2019-01-24 ENCOUNTER — Other Ambulatory Visit: Payer: Self-pay

## 2019-01-24 ENCOUNTER — Ambulatory Visit
Admission: RE | Admit: 2019-01-24 | Discharge: 2019-01-24 | Disposition: A | Discharge status: Home / Self Care | Payer: PRIVATE HEALTH INSURANCE | Source: Ambulatory Visit | Attending: Urology | Admitting: Urology

## 2019-01-24 DIAGNOSIS — C673 Malignant neoplasm of anterior wall of bladder: Secondary | ICD-10-CM | POA: Diagnosis present

## 2019-01-24 HISTORY — DX: Malignant (primary) neoplasm, unspecified: C80.1

## 2019-01-24 MED ORDER — IOHEXOL 300 MG/ML  SOLN
125.0000 mL | Freq: Once | INTRAMUSCULAR | Status: AC | PRN
  Administered 2019-01-24: 125 mL via INTRAVENOUS

## 2019-02-23 ENCOUNTER — Other Ambulatory Visit: Payer: Self-pay | Admitting: Family Medicine

## 2019-05-15 ENCOUNTER — Encounter: Payer: Self-pay | Admitting: Family Medicine

## 2019-05-15 ENCOUNTER — Ambulatory Visit (INDEPENDENT_AMBULATORY_CARE_PROVIDER_SITE_OTHER): Payer: PRIVATE HEALTH INSURANCE | Admitting: Family Medicine

## 2019-05-15 ENCOUNTER — Other Ambulatory Visit: Payer: Self-pay

## 2019-05-15 VITALS — Ht 73.0 in | Wt 226.0 lb

## 2019-05-15 DIAGNOSIS — K219 Gastro-esophageal reflux disease without esophagitis: Secondary | ICD-10-CM

## 2019-05-15 DIAGNOSIS — F3342 Major depressive disorder, recurrent, in full remission: Secondary | ICD-10-CM

## 2019-05-15 MED ORDER — OMEPRAZOLE 20 MG PO CPDR: DELAYED_RELEASE_CAPSULE | ORAL | 1 refills | Status: DC

## 2019-05-15 MED ORDER — LAMOTRIGINE 100 MG PO TABS: 100.0000 mg | ORAL_TABLET | Freq: Every day | ORAL | 1 refills | Status: DC

## 2019-05-15 MED ORDER — ESCITALOPRAM OXALATE 10 MG PO TABS: 10.0000 mg | ORAL_TABLET | Freq: Every day | ORAL | 1 refills | Status: DC

## 2019-05-15 NOTE — Assessment & Plan Note (Signed)
Stable and under good control, continue current regimen 

## 2019-05-15 NOTE — Progress Notes (Signed)
Ht 6\' 1"  (1.854 m)   Wt 226 lb (102.5 kg)   BMI 29.82 kg/m    Subjective:    Patient ID: Scott Chan, male    DOB: Mar 11, 1966, 53 y.o.   MRN: [id number]  HPI: Scott Chan is a 53 y.o. male  Chief Complaint  Patient presents with  . Depression    lexapro refill    . This visit was completed via WebEx due to the restrictions of the COVID-19 pandemic. All issues as above were discussed and addressed. Physical exam was done as above through visual confirmation on WebEx. If it was felt that the patient should be evaluated in the office, they were directed there. The patient verbally consented to this visit. . Location of the patient: home . Location of the provider: home . Those involved with this call:  . Provider: Merrie Roof, PA-C . CMA: Lesle Chris, [place] . Front Desk/Registration: Jill Side  . Time spent on call: 15 minutes with patient face to face via video conference. More than 50% of this time was spent in counseling and coordination of care. 5 minutes total spent in review of patient's record and preparation of their chart. I verified patient identity using two factors (patient name and date of birth). Patient consents verbally to being seen via telemedicine visit today.   Patient here today for follow up moods and GERD. Medications doing well, taking them faithfully without side effects. Moods under good control, denies SI/HI, anhedonia, sleep or appetite issues. No breakthrough reflux sxs noted.   Depression screen [facility] 2/9 05/15/2019 09/13/2017 09/08/2016  Decreased Interest 0 0 0  Down, Depressed, Hopeless 0 0 0  PHQ - 2 Score 0 0 0  Altered sleeping 0 - -  Tired, decreased energy 1 - -  Change in appetite 0 - -  Feeling bad or failure about yourself  0 - -  Trouble concentrating 0 - -  Moving slowly or fidgety/restless 0 - -  Suicidal thoughts 0 - -  PHQ-9 Score 1 - -  Difficult doing work/chores Not difficult at all - -   Relevant past medical,  surgical, family and social history reviewed and updated as indicated. Interim medical history since our last visit reviewed. Allergies and medications reviewed and updated.  Review of Systems  Per HPI unless specifically indicated above     Objective:    Ht 6\' 1"  (1.854 m)   Wt 226 lb (102.5 kg)   BMI 29.82 kg/m   Wt Readings from Last 3 Encounters:  05/15/19 226 lb (102.5 kg)  01/02/19 220 lb (99.8 kg)  12/28/18 220 lb (99.8 kg)    Physical Exam Vitals and nursing note reviewed.  Constitutional:      General: He is not in acute distress.    Appearance: Normal appearance.  HENT:     Head: Atraumatic.     Right Ear: External ear normal.     Left Ear: External ear normal.     Nose: Nose normal. No congestion.     Mouth/Throat:     Mouth: Mucous membranes are moist.     Pharynx: Oropharynx is clear.  Eyes:     Extraocular Movements: Extraocular movements intact.     Conjunctiva/sclera: Conjunctivae normal.  Cardiovascular:     Rate and Rhythm: Normal rate and regular rhythm.  Pulmonary:     Effort: Pulmonary effort is normal. No respiratory distress.  Musculoskeletal:        General: Normal range of  motion.     Cervical back: Normal range of motion.  Skin:    General: Skin is dry.     Findings: No erythema or rash.  Neurological:     Mental Status: He is oriented to person, place, and time.  Psychiatric:        Mood and Affect: Mood normal.        Thought Content: Thought content normal.        Judgment: Judgment normal.     Results for orders placed or performed during the hospital encounter of 01/02/19  Surgical pathology  Result Value Ref Range   SURGICAL PATHOLOGY      Surgical Pathology CASE: [id number] PATIENT: Scott Chan Surgical Pathology Report     SPECIMEN SUBMITTED: A. Bladder tumor  CLINICAL HISTORY: None provided  PRE-OPERATIVE DIAGNOSIS: Bladder tumor  POST-OPERATIVE DIAGNOSIS: Same as pre-op     DIAGNOSIS: A.  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 - SEE COMMENT.  Comment: Sections demonstrate superficial strips of urothelium with significant nuclear enlargement, pleomorphism, and hyperchromasia.  Fibrovascular cores are noted.  Underlying lamia propria is not present to evaluate for invasion.  Muscularis propria is also not present for evaluation.   GROSS DESCRIPTION: A. Labeled: Bladder tumor Received: In formalin Tissue fragment(s): 1 Size: 0.8 x 0.6 x 0.1 cm Description: Pink soft tissue fragment Entirely submitted in 1 cassette.   Final Diagnosis performed by Betsy Pries, MD.   Electronically signed 01/04/2019 3:0 1:31PM The electronic signature indicates that the named Attending Pathologist has evaluated the specimen  Technical component performed at [facility], [address] [place], [place] [address] Lab: [phone] Dir: Rush Farmer, MD, MMM  Professional component performed at [facility], [facility], [place], [place], [place] [address] Lab: [phone] Dir: Dellia Nims. Rubinas, MD       Assessment & Plan:   Problem List Items Addressed This Visit      Digestive   GERD (gastroesophageal reflux disease) - Primary    Stable and under good control, continue current regimen      Relevant Medications   omeprazole (PRILOSEC) 20 MG capsule     Other   Depression    Moods stable and under good control, continue current regimen      Relevant Medications   escitalopram (LEXAPRO) 10 MG tablet       Follow up plan: Return for CPE as soon as possible, patient requesting .

## 2019-05-16 NOTE — Assessment & Plan Note (Signed)
Moods stable and under good control, continue current regimen

## 2019-06-07 ENCOUNTER — Encounter: Payer: PRIVATE HEALTH INSURANCE | Admitting: Family Medicine

## 2019-06-07 ENCOUNTER — Ambulatory Visit: Payer: PRIVATE HEALTH INSURANCE | Attending: Internal Medicine

## 2019-06-07 DIAGNOSIS — Z20822 Contact with and (suspected) exposure to covid-19: Secondary | ICD-10-CM

## 2019-06-08 LAB — NOVEL CORONAVIRUS, NAA: SARS-CoV-2, NAA: NOT DETECTED

## 2019-07-09 ENCOUNTER — Encounter: Payer: PRIVATE HEALTH INSURANCE | Admitting: Family Medicine

## 2019-07-20 ENCOUNTER — Ambulatory Visit (INDEPENDENT_AMBULATORY_CARE_PROVIDER_SITE_OTHER): Payer: PRIVATE HEALTH INSURANCE | Admitting: Family Medicine

## 2019-07-20 ENCOUNTER — Other Ambulatory Visit: Payer: Self-pay

## 2019-07-20 ENCOUNTER — Encounter: Payer: Self-pay | Admitting: Family Medicine

## 2019-07-20 VITALS — BP 131/87 | HR 81 | Temp 97.7°F | Ht 72.5 in | Wt 221.0 lb

## 2019-07-20 DIAGNOSIS — G4730 Sleep apnea, unspecified: Secondary | ICD-10-CM | POA: Diagnosis not present

## 2019-07-20 DIAGNOSIS — K219 Gastro-esophageal reflux disease without esophagitis: Secondary | ICD-10-CM

## 2019-07-20 DIAGNOSIS — F3342 Major depressive disorder, recurrent, in full remission: Secondary | ICD-10-CM

## 2019-07-20 DIAGNOSIS — Z125 Encounter for screening for malignant neoplasm of prostate: Secondary | ICD-10-CM | POA: Diagnosis not present

## 2019-07-20 DIAGNOSIS — Z0000 Encounter for general adult medical examination without abnormal findings: Secondary | ICD-10-CM

## 2019-07-20 DIAGNOSIS — E291 Testicular hypofunction: Secondary | ICD-10-CM | POA: Diagnosis not present

## 2019-07-20 LAB — UA/M W/RFLX CULTURE, ROUTINE
Bilirubin, UA: NEGATIVE
Glucose, UA: NEGATIVE
Ketones, UA: NEGATIVE
Leukocytes,UA: NEGATIVE
Nitrite, UA: NEGATIVE
Protein,UA: NEGATIVE
RBC, UA: NEGATIVE
Specific Gravity, UA: 1.015 (ref 1.005–1.030)
Urobilinogen, Ur: 0.2 mg/dL (ref 0.2–1.0)
pH, UA: 5.5 (ref 5.0–7.5)

## 2019-07-20 NOTE — Progress Notes (Signed)
BP 131/87   Pulse 81   Temp 97.7 F (36.5 C) (Oral)   Ht 6' 0.5" (1.842 m)   Wt 221 lb (100.2 kg)   SpO2 98%   BMI 29.56 kg/m    Subjective:    Patient ID: Scott Chan, male    DOB: 05-20-1965, 54 y.o.   MRN: [id number]  HPI: Scott Chan is a 54 y.o. male presenting on 07/20/2019 for comprehensive medical examination. Current medical complaints include:see below  GERD - Prilosec controlling sxs well without breakthrough sxs. Denies abdominal pain, N/V, melena.   OSA - Stable, sleeping well without concerns.   Hypogonadism - on androgel, not noticing a big difference since starting it. Following with Urology for this.   Depression - moods stable on lexapro and lamictal regimen. He recently retired in December and states he's been so much more relaxed since then. Denies side effects, Cp, SOB.   He currently lives with: Interim Problems from his last visit: no  Depression Screen done today and results listed below:  Depression screen [facility] 2/9 07/20/2019 05/15/2019 09/13/2017 09/08/2016 07/01/2016  Decreased Interest 0 0 0 0 0  Down, Depressed, Hopeless 0 0 0 0 0  PHQ - 2 Score 0 0 0 0 0  Altered sleeping 0 0 - - -  Tired, decreased energy 0 1 - - -  Change in appetite 0 0 - - -  Feeling bad or failure about yourself  0 0 - - -  Trouble concentrating 0 0 - - -  Moving slowly or fidgety/restless 0 0 - - -  Suicidal thoughts 0 0 - - -  PHQ-9 Score 0 1 - - -  Difficult doing work/chores - Not difficult at all - - -    The patient does not have a history of falls. I did complete a risk assessment for falls. A plan of care for falls was documented.   Past Medical History:  Past Medical History:  Diagnosis Date  . Cancer ([place])   . Depression   . GERD (gastroesophageal reflux disease)   . History of kidney stones    h/o  . Hypotension   . Orbital fracture ([place]) 1986  . Sleep apnea    no cpap    Surgical History:  Past Surgical History:  Procedure Laterality  Date  . COLONOSCOPY WITH PROPOFOL N/A 09/28/2016   Procedure: COLONOSCOPY WITH PROPOFOL;  Surgeon: Lucilla Lame, MD;  Location: [facility] ENDOSCOPY;  Service: Endoscopy;  Laterality: N/A;  . ESOPHAGOGASTRODUODENOSCOPY    . nephrolithics  2003  . NOSE SURGERY    . TONSILLECTOMY  2007  . TRANSURETHRAL RESECTION OF BLADDER TUMOR N/A 08/22/2018   Procedure: TRANSURETHRAL RESECTION OF BLADDER TUMOR (TURBT) BT;  Surgeon: Royston Cowper, MD;  Location: [facility] ORS;  Service: Urology;  Laterality: N/A;  . TRANSURETHRAL RESECTION OF BLADDER TUMOR N/A 01/02/2019   Procedure: TRANSURETHRAL RESECTION OF BLADDER TUMOR (TURBT);  Surgeon: Royston Cowper, MD;  Location: [facility] ORS;  Service: Urology;  Laterality: N/A;    Medications:  Current Outpatient Medications on File Prior to Visit  Medication Sig  . escitalopram (LEXAPRO) 10 MG tablet Take 1 tablet (10 mg total) by mouth daily.  Marland Kitchen ibuprofen (ADVIL,MOTRIN) 200 MG tablet Take 800 mg by mouth every 6 (six) hours as needed for moderate pain.  Marland Kitchen lamoTRIgine (LAMICTAL) 100 MG tablet Take 1 tablet (100 mg total) by mouth daily.  . Multiple Vitamin (MULTIVITAMIN) capsule Take 1 capsule by mouth daily.  Marland Kitchen  omeprazole (PRILOSEC) 20 MG capsule TAKE 1 CAPSULE BY MOUTH ONCE DAILY AS NEEDED  . Testosterone 20.25 MG/ACT (1.62%) GEL daily.    No current facility-administered medications on file prior to visit.    Allergies:  No Known Allergies  Social History:  Social History   Socioeconomic History  . Marital status: Married    Spouse name: Not on file  . Number of children: Not on file  . Years of education: Not on file  . Highest education level: Not on file  Occupational History  . Not on file  Tobacco Use  . Smoking status: Never Smoker  . Smokeless tobacco: Never Used  Substance and Sexual Activity  . Alcohol use: Yes    Alcohol/week: 2.0 standard drinks    Types: 2 Cans of beer per week    Comment: occasional  . Drug use: No  . Sexual activity:  Not on file  Other Topics Concern  . Not on file  Social History Narrative  . Not on file   Social Determinants of Health   Financial Resource Strain:   . Difficulty of Paying Living Expenses:   Food Insecurity:   . Worried About Charity fundraiser in the Last Year:   . Arboriculturist in the Last Year:   Transportation Needs:   . Film/video editor (Medical):   Marland Kitchen Lack of Transportation (Non-Medical):   Physical Activity:   . Days of Exercise per Week:   . Minutes of Exercise per Session:   Stress:   . Feeling of Stress :   Social Connections:   . Frequency of Communication with Friends and Family:   . Frequency of Social Gatherings with Friends and Family:   . Attends Religious Services:   . Active Member of Clubs or Organizations:   . Attends Archivist Meetings:   Marland Kitchen Marital Status:   Intimate Partner Violence:   . Fear of Current or Ex-Partner:   . Emotionally Abused:   Marland Kitchen Physically Abused:   . Sexually Abused:    Social History   Tobacco Use  Smoking Status Never Smoker  Smokeless Tobacco Never Used   Social History   Substance and Sexual Activity  Alcohol Use Yes  . Alcohol/week: 2.0 standard drinks  . Types: 2 Cans of beer per week   Comment: occasional    Family History:  Family History  Problem Relation Age of Onset  . Hypertension Father   . Ovarian cancer Mother   . Hypertension Brother   . Prostate cancer Brother   . Heart attack Brother     Past medical history, surgical history, medications, allergies, family history and social history reviewed with patient today and changes made to appropriate areas of the chart.   Review of Systems - General ROS: negative Psychological ROS: negative Ophthalmic ROS: negative ENT ROS: negative Allergy and Immunology ROS: negative Hematological and Lymphatic ROS: negative Endocrine ROS: negative Breast ROS: negative for breast lumps Respiratory ROS: no cough, shortness of breath, or  wheezing Cardiovascular ROS: no chest pain or dyspnea on exertion Gastrointestinal ROS: no abdominal pain, change in bowel habits, or black or bloody stools Genito-Urinary ROS: no dysuria, trouble voiding, or hematuria Musculoskeletal ROS: negative Neurological ROS: no TIA or stroke symptoms Dermatological ROS: negative All other ROS negative except what is listed above and in the HPI.      Objective:    BP 131/87   Pulse 81   Temp 97.7 F (36.5 C) (Oral)  Ht 6' 0.5" (1.842 m)   Wt 221 lb (100.2 kg)   SpO2 98%   BMI 29.56 kg/m   Wt Readings from Last 3 Encounters:  07/20/19 221 lb (100.2 kg)  05/15/19 226 lb (102.5 kg)  01/02/19 220 lb (99.8 kg)    Physical Exam Vitals and nursing note reviewed.  Constitutional:      General: He is not in acute distress.    Appearance: He is well-developed.  HENT:     Head: Atraumatic.     Right Ear: Tympanic membrane and external ear normal.     Left Ear: Tympanic membrane and external ear normal.     Nose: Nose normal.     Mouth/Throat:     Mouth: Mucous membranes are moist.     Pharynx: Oropharynx is clear.  Eyes:     General: No scleral icterus.    Conjunctiva/sclera: Conjunctivae normal.     Pupils: Pupils are equal, round, and reactive to light.  Cardiovascular:     Rate and Rhythm: Normal rate and regular rhythm.     Heart sounds: Normal heart sounds. No murmur.  Pulmonary:     Effort: Pulmonary effort is normal. No respiratory distress.     Breath sounds: Normal breath sounds.  Abdominal:     General: Bowel sounds are normal. There is no distension.     Palpations: Abdomen is soft. There is no mass.     Tenderness: There is no abdominal tenderness. There is no guarding.  Genitourinary:    Comments: GU exam done through Urology Musculoskeletal:        General: No tenderness. Normal range of motion.     Cervical back: Normal range of motion and neck supple.  Skin:    General: Skin is warm and dry.     Findings: No  rash.  Neurological:     General: No focal deficit present.     Mental Status: He is alert and oriented to person, place, and time.     Deep Tendon Reflexes: Reflexes are normal and symmetric.  Psychiatric:        Mood and Affect: Mood normal.        Behavior: Behavior normal.        Thought Content: Thought content normal.        Judgment: Judgment normal.     Results for orders placed or performed in visit on 07/20/19  CBC with Differential/Platelet  Result Value Ref Range   WBC 5.7 3.4 - 10.8 x10E3/uL   RBC 5.71 4.14 - 5.80 x10E6/uL   Hemoglobin 14.6 13.0 - 17.7 g/dL   Hematocrit 45.0 37.5 - 51.0 %   MCV 79 79 - 97 fL   MCH 25.6 (L) 26.6 - 33.0 pg   MCHC 32.4 31.5 - 35.7 g/dL   RDW 15.7 (H) 11.6 - 15.4 %   Platelets 263 150 - 450 x10E3/uL   Neutrophils 67 Not Estab. %   Lymphs 22 Not Estab. %   Monocytes 9 Not Estab. %   Eos 1 Not Estab. %   Basos 1 Not Estab. %   Neutrophils Absolute 3.9 1.4 - 7.0 x10E3/uL   Lymphocytes Absolute 1.2 0.7 - 3.1 x10E3/uL   Monocytes Absolute 0.5 0.1 - 0.9 x10E3/uL   EOS (ABSOLUTE) 0.1 0.0 - 0.4 x10E3/uL   Basophils Absolute 0.0 0.0 - 0.2 x10E3/uL   Immature Granulocytes 0 Not Estab. %   Immature Grans (Abs) 0.0 0.0 - 0.1 x10E3/uL  Comprehensive metabolic panel  Result Value Ref Range   Glucose 85 65 - 99 mg/dL   BUN 16 6 - 24 mg/dL   Creatinine, Ser 1.05 0.76 - 1.27 mg/dL   GFR calc non Af Amer 81 >59 mL/min/1.73   GFR calc Af Amer 93 >59 mL/min/1.73   BUN/Creatinine Ratio 15 9 - 20   Sodium 139 134 - 144 mmol/L   Potassium 4.2 3.5 - 5.2 mmol/L   Chloride 103 96 - 106 mmol/L   CO2 22 20 - 29 mmol/L   Calcium 9.5 8.7 - 10.2 mg/dL   Total Protein 7.2 6.0 - 8.5 g/dL   Albumin 4.6 3.8 - 4.9 g/dL   Globulin, Total 2.6 1.5 - 4.5 g/dL   Albumin/Globulin Ratio 1.8 1.2 - 2.2   Bilirubin Total 0.8 0.0 - 1.2 mg/dL   Alkaline Phosphatase 111 39 - 117 IU/L   AST 25 0 - 40 IU/L   ALT 25 0 - 44 IU/L  Lipid Panel w/o Chol/HDL Ratio  Result  Value Ref Range   Cholesterol, Total 164 100 - 199 mg/dL   Triglycerides 103 0 - 149 mg/dL   HDL 36 (L) >39 mg/dL   VLDL Cholesterol Cal 19 5 - 40 mg/dL   LDL Chol Calc (NIH) 109 (H) 0 - 99 mg/dL  TSH  Result Value Ref Range   TSH 1.620 0.450 - 4.500 uIU/mL  UA/M w/rflx Culture, Routine   Specimen: Urine   URINE  Result Value Ref Range   Specific Gravity, UA 1.015 1.005 - 1.030   pH, UA 5.5 5.0 - 7.5   Color, UA Yellow Yellow   Appearance Ur Clear Clear   Leukocytes,UA Negative Negative   Protein,UA Negative Negative/Trace   Glucose, UA Negative Negative   Ketones, UA Negative Negative   RBC, UA Negative Negative   Bilirubin, UA Negative Negative   Urobilinogen, Ur 0.2 0.2 - 1.0 mg/dL   Nitrite, UA Negative Negative  PSA  Result Value Ref Range   Prostate Specific Ag, Serum 0.5 0.0 - 4.0 ng/mL      Assessment & Plan:   Problem List Items Addressed This Visit      Respiratory   Sleep apnea    Stable and under good control off cpap, continue managing healthy weight and sleep hygiene        Digestive   GERD (gastroesophageal reflux disease)    Stable and well controlled, continue current regimen        Endocrine   Hypogonadism in male - Primary    On androgel monitored by Urology, continue per their recommendations        Other   Depression    Stable and well controlled, considering eventually tapering off medication but wanting to continue for now. Will revisit at future visits       Other Visit Diagnoses    Annual physical exam       Relevant Orders   CBC with Differential/Platelet (Completed)   Comprehensive metabolic panel (Completed)   Lipid Panel w/o Chol/HDL Ratio (Completed)   TSH (Completed)   UA/M w/rflx Culture, Routine (Completed)   Screening for prostate cancer       Relevant Orders   PSA (Completed)       Discussed aspirin prophylaxis for myocardial infarction prevention and decision was it was not indicated  LABORATORY TESTING:    Health maintenance labs ordered today as discussed above.   The natural history of prostate cancer and ongoing controversy regarding screening and potential treatment  outcomes of prostate cancer has been discussed with the patient. The meaning of a false positive PSA and a false negative PSA has been discussed. He indicates understanding of the limitations of this screening test and wishes to proceed with screening PSA testing.   IMMUNIZATIONS:   - Tdap: Tetanus vaccination status reviewed: last tetanus booster within 10 years. - Influenza: Up to date  SCREENING: - Colonoscopy: Up to date  Discussed with patient purpose of the colonoscopy is to detect colon cancer at curable precancerous or early stages   PATIENT COUNSELING:    Sexuality: Discussed sexually transmitted diseases, partner selection, use of condoms, avoidance of unintended pregnancy  and contraceptive alternatives.   Advised to avoid cigarette smoking.  I discussed with the patient that most people either abstain from alcohol or drink within safe limits (<=14/week and <=4 drinks/occasion for males, <=7/weeks and <= 3 drinks/occasion for females) and that the risk for alcohol disorders and other health effects rises proportionally with the number of drinks per week and how often a drinker exceeds daily limits.  Discussed cessation/primary prevention of drug use and availability of treatment for abuse.   Diet: Encouraged to adjust caloric intake to maintain  or achieve ideal body weight, to reduce intake of dietary saturated fat and total fat, to limit sodium intake by avoiding high sodium foods and not adding table salt, and to maintain adequate dietary potassium and calcium preferably from fresh fruits, vegetables, and low-fat dairy products.    stressed the importance of regular exercise  Injury prevention: Discussed safety belts, safety helmets, smoke detector, smoking near bedding or upholstery.   Dental health:  Discussed importance of regular tooth brushing, flossing, and dental visits.   Follow up plan: NEXT PREVENTATIVE PHYSICAL DUE IN 1 YEAR. Return in about 6 months (around 01/20/2020) for 6 month f/u.

## 2019-07-21 LAB — CBC WITH DIFFERENTIAL/PLATELET
Basophils Absolute: 0 10*3/uL (ref 0.0–0.2)
Basos: 1 %
EOS (ABSOLUTE): 0.1 10*3/uL (ref 0.0–0.4)
Eos: 1 %
Hematocrit: 45 % (ref 37.5–51.0)
Hemoglobin: 14.6 g/dL (ref 13.0–17.7)
Immature Grans (Abs): 0 10*3/uL (ref 0.0–0.1)
Immature Granulocytes: 0 %
Lymphocytes Absolute: 1.2 10*3/uL (ref 0.7–3.1)
Lymphs: 22 %
MCH: 25.6 pg — ABNORMAL LOW (ref 26.6–33.0)
MCHC: 32.4 g/dL (ref 31.5–35.7)
MCV: 79 fL (ref 79–97)
Monocytes Absolute: 0.5 10*3/uL (ref 0.1–0.9)
Monocytes: 9 %
Neutrophils Absolute: 3.9 10*3/uL (ref 1.4–7.0)
Neutrophils: 67 %
Platelets: 263 10*3/uL (ref 150–450)
RBC: 5.71 x10E6/uL (ref 4.14–5.80)
RDW: 15.7 % — ABNORMAL HIGH (ref 11.6–15.4)
WBC: 5.7 10*3/uL (ref 3.4–10.8)

## 2019-07-21 LAB — PSA: Prostate Specific Ag, Serum: 0.5 ng/mL (ref 0.0–4.0)

## 2019-07-21 LAB — COMPREHENSIVE METABOLIC PANEL
ALT: 25 IU/L (ref 0–44)
AST: 25 IU/L (ref 0–40)
Albumin/Globulin Ratio: 1.8 (ref 1.2–2.2)
Albumin: 4.6 g/dL (ref 3.8–4.9)
Alkaline Phosphatase: 111 IU/L (ref 39–117)
BUN/Creatinine Ratio: 15 (ref 9–20)
BUN: 16 mg/dL (ref 6–24)
Bilirubin Total: 0.8 mg/dL (ref 0.0–1.2)
CO2: 22 mmol/L (ref 20–29)
Calcium: 9.5 mg/dL (ref 8.7–10.2)
Chloride: 103 mmol/L (ref 96–106)
Creatinine, Ser: 1.05 mg/dL (ref 0.76–1.27)
GFR calc Af Amer: 93 mL/min/{1.73_m2} (ref >59)
GFR calc non Af Amer: 81 mL/min/{1.73_m2} (ref >59)
Globulin, Total: 2.6 g/dL (ref 1.5–4.5)
Glucose: 85 mg/dL (ref 65–99)
Potassium: 4.2 mmol/L (ref 3.5–5.2)
Sodium: 139 mmol/L (ref 134–144)
Total Protein: 7.2 g/dL (ref 6.0–8.5)

## 2019-07-21 LAB — TSH: TSH: 1.62 u[IU]/mL (ref 0.450–4.500)

## 2019-07-21 LAB — LIPID PANEL W/O CHOL/HDL RATIO
Cholesterol, Total: 164 mg/dL (ref 100–199)
HDL: 36 mg/dL — ABNORMAL LOW (ref >39)
LDL Chol Calc (NIH): 109 mg/dL — ABNORMAL HIGH (ref 0–99)
Triglycerides: 103 mg/dL (ref 0–149)
VLDL Cholesterol Cal: 19 mg/dL (ref 5–40)

## 2019-07-30 NOTE — Assessment & Plan Note (Signed)
Stable and well controlled, considering eventually tapering off medication but wanting to continue for now. Will revisit at future visits

## 2019-07-30 NOTE — Assessment & Plan Note (Signed)
Stable and well controlled, continue current regimen 

## 2019-07-30 NOTE — Assessment & Plan Note (Signed)
Stable and under good control off cpap, continue managing healthy weight and sleep hygiene

## 2019-07-30 NOTE — Assessment & Plan Note (Signed)
On androgel monitored by Urology, continue per their recommendations

## 2019-08-07 ENCOUNTER — Other Ambulatory Visit: Payer: Self-pay | Admitting: Family Medicine

## 2019-08-13 NOTE — Progress Notes (Signed)
Date:  08/14/2019   ID:  Scott Chan, DOB [birth year], MRN [id number]  Patient Location:  [address] [place] [address]   Provider location:   Scott Chan, [place] office  PCP:  Scott American, PA-C  Cardiologist:  Scott Chan [facility]   Chief Complaint  Patient presents with  . office visit    Episode of Lightheadedness/Dizziness 2 days ago; Meds verbally reviewed with patient.     History of Present Illness:    Scott Chan is a 54 y.o. male who presents via audio/video conferencing for a telehealth visit today.   The patient does not symptoms concerning for COVID-19 infection (fever, chills, cough, or new SHORTNESS OF BREATH).   Patient has a past medical history of Episodes of chest pain.  Near syncope SOB OSA, unable to afford CPAP athritis Depression Non smoker Fm hx of CAD Patient reports history of possible MI in brother in his 6s.  Stents in his father in his 36s.  Who presents today for follow-up of chest pain and shortness of breath  This past weekend, ran hard after his dog After a sprint, felt severe shortness of breath, almost felt like he was going to pass out  Has been less active recently, used to play basketball on a regular basis Otherwise has good ADLs, does lots of yard work  Denies any significant chest pain Retired from the [employer] on today's visit, blood pressure remaining 120s over 80s, no change in heart rate with supine to standing  EKG personally reviewed by myself on todays visit Shows normal sinus rhythm rate 87 bpm no significant ST-T wave changes  Other past medical history reviewed Event Monitor Normal sinus rhythm Avg HR of 89 bpm.  Isolated SVEs were rare (<1.0%), SVE Couplets were rare (<1.0%), and SVE Triplets were rare (<1.0%). Isolated VEs were rare (<1.0%), and no VE Couplets or VE Triplets were present. Patient triggered events were not associated with  significant arrhythmia. Legs weak  Stress test Showed no ischemia Minimal plaque in the aortic arch  Lab work reviewed in detail Total Chol 164/ LDL 80   CT coronary calcium score  01/2018 Coronary calcium score of 9. This was 82 percentile for age and sex matched control.  Stress test and echo previously ordered 07/08/2016, did not complete  Prior CV studies:   The following studies were reviewed today:    Past Medical History:  Diagnosis Date  . Cancer ([place])   . Depression   . GERD (gastroesophageal reflux disease)   . History of kidney stones    h/o  . Hypotension   . Orbital fracture ([place]) 1986  . Sleep apnea    no cpap   Past Surgical History:  Procedure Laterality Date  . COLONOSCOPY WITH PROPOFOL N/A 09/28/2016   Procedure: COLONOSCOPY WITH PROPOFOL;  Surgeon: Scott Lame, MD;  Location[facility] ENDOSCOPY;  Service: Endoscopy;  Laterality: N/A;  . ESOPHAGOGASTRODUODENOSCOPY    . nephrolithics  2003  . NOSE SURGERY    . TONSILLECTOMY  2007  . TRANSURETHRAL RESECTION OF BLADDER TUMOR N/A 08/22/2018   Procedure: TRANSURETHRAL RESECTION OF BLADDER TUMOR (TURBT) BT;  Surgeon: Scott Cowper, MD;  Location: [facility] ORS;  Service: Urology;  Laterality: N/A;  . TRANSURETHRAL RESECTION OF BLADDER TUMOR N/A 01/02/2019   Procedure: TRANSURETHRAL RESECTION OF BLADDER TUMOR (TURBT);  Surgeon: Scott Cowper, MD;  Location: [facility] ORS;  Service: Urology;  Laterality: N/A;  Current Meds  Medication Sig  . escitalopram (LEXAPRO) 10 MG tablet Take 1 tablet (10 mg total) by mouth daily.  Marland Kitchen ibuprofen (ADVIL,MOTRIN) 200 MG tablet Take 800 mg by mouth every 6 (six) hours as needed for moderate pain.  Marland Kitchen lamoTRIgine (LAMICTAL) 100 MG tablet Take 1 tablet (100 mg total) by mouth daily.  . Multiple Vitamin (MULTIVITAMIN) capsule Take 1 capsule by mouth daily.  Marland Kitchen omeprazole (PRILOSEC) 20 MG capsule TAKE 1 CAPSULE BY MOUTH ONCE DAILY AS NEEDED  . Testosterone 20.25 MG/ACT (1.62%) GEL  daily.      Allergies:   Patient has no known allergies.   Social History   Tobacco Use  . Smoking status: Never Smoker  . Smokeless tobacco: Never Used  Substance Use Topics  . Alcohol use: Yes    Alcohol/week: 2.0 standard drinks    Types: 2 Cans of beer per week    Comment: occasional  . Drug use: No     Current Outpatient Medications on File Prior to Visit  Medication Sig Dispense Refill  . escitalopram (LEXAPRO) 10 MG tablet Take 1 tablet (10 mg total) by mouth daily. 90 tablet 1  . ibuprofen (ADVIL,MOTRIN) 200 MG tablet Take 800 mg by mouth every 6 (six) hours as needed for moderate pain.    Marland Kitchen lamoTRIgine (LAMICTAL) 100 MG tablet Take 1 tablet (100 mg total) by mouth daily. 90 tablet 1  . Multiple Vitamin (MULTIVITAMIN) capsule Take 1 capsule by mouth daily.    Marland Kitchen omeprazole (PRILOSEC) 20 MG capsule TAKE 1 CAPSULE BY MOUTH ONCE DAILY AS NEEDED 90 capsule 1  . Testosterone 20.25 MG/ACT (1.62%) GEL daily.      No current facility-administered medications on file prior to visit.     Family Hx: The patient's family history includes Heart attack in his brother; Hypertension in his brother and father; Ovarian cancer in his mother; Prostate cancer in his brother.  ROS:   Please see the history of present illness.    Review of Systems  Constitutional: Negative.   HENT: Negative.   Respiratory: Negative.   Cardiovascular: Negative.   Gastrointestinal: Negative.   Musculoskeletal: Negative.   Neurological: Negative.   Psychiatric/Behavioral: Negative.   All other systems reviewed and are negative.     Labs/Other Tests and Data Reviewed:    Recent Labs: 07/20/2019: ALT 25; BUN 16; Creatinine, Ser 1.05; Hemoglobin 14.6; Platelets 263; Potassium 4.2; Sodium 139; TSH 1.620   Recent Lipid Panel Lab Results  Component Value Date/Time   CHOL 164 07/20/2019 09:56 AM   TRIG 103 07/20/2019 09:56 AM   HDL 36 (L) 07/20/2019 09:56 AM   CHOLHDL 4.4 09/13/2017 04:24 PM   LDLCALC  109 (H) 07/20/2019 09:56 AM    Wt Readings from Last 3 Encounters:  08/14/19 221 lb 4 oz (100.4 kg)  07/20/19 221 lb (100.2 kg)  05/15/19 226 lb (102.5 kg)     Exam:    Vital Signs: Vital signs may also be detailed in the HPI BP 126/80 (BP Location: Left Arm, Patient Position: Sitting, Cuff Size: Normal)   Pulse 87   Ht 6' (1.829 m)   Wt 221 lb 4 oz (100.4 kg)   SpO2 97%   BMI 30.01 kg/m    Constitutional:  oriented to person, place, and time. No distress.  HENT:  Head: Grossly normal Eyes:  no discharge. No scleral icterus.  Neck: No JVD, no carotid bruits  Cardiovascular: Regular rate and rhythm, no murmurs appreciated Pulmonary/Chest: Clear to  auscultation bilaterally, no wheezes or rails Abdominal: Soft.  no distension.  no tenderness.  Musculoskeletal: Normal range of motion Neurological:  normal muscle tone. Coordination normal. No atrophy Skin: Skin warm and dry Psychiatric: normal affect, pleasant   ASSESSMENT & PLAN:    Problem List Items Addressed This Visit    Sleep apnea    Other Visit Diagnoses    Chest pain, unspecified type    -  Primary   Relevant Orders   EKG 12-Lead   Exertional dyspnea       Palpitations       Dizziness         Chest pain, shortness of breath We went through his previous CT coronary calcium score, recent stress test, event monitor, no significant abnormalities noted No recent episodes of chest pain No further work-up at this time  SOB (shortness of breath) Recommended weight loss, exercise for conditioning Suggest he try regular exercise program  OSA (obstructive sleep apnea) Wears CPAP   Gastroesophageal Reflux Disease, esophagitis presence not specified On PPI    Disposition: Follow-up  as needed   Signed, Ida Rogue, MD  08/14/2019 3:52 PM    [place] Office [id number], [place], [place] [id number]

## 2019-08-14 ENCOUNTER — Ambulatory Visit (INDEPENDENT_AMBULATORY_CARE_PROVIDER_SITE_OTHER): Payer: PRIVATE HEALTH INSURANCE | Admitting: Cardiovascular Disease

## 2019-08-14 ENCOUNTER — Encounter: Payer: Self-pay | Admitting: Cardiovascular Disease

## 2019-08-14 ENCOUNTER — Other Ambulatory Visit: Payer: Self-pay

## 2019-08-14 VITALS — BP 126/80 | HR 87 | Ht 72.0 in | Wt 221.2 lb

## 2019-08-14 DIAGNOSIS — R42 Dizziness and giddiness: Secondary | ICD-10-CM

## 2019-08-14 DIAGNOSIS — R079 Chest pain, unspecified: Secondary | ICD-10-CM

## 2019-08-14 DIAGNOSIS — R0600 Dyspnea, unspecified: Secondary | ICD-10-CM

## 2019-08-14 DIAGNOSIS — R002 Palpitations: Secondary | ICD-10-CM | POA: Diagnosis not present

## 2019-08-14 DIAGNOSIS — G4730 Sleep apnea, unspecified: Secondary | ICD-10-CM

## 2019-08-14 DIAGNOSIS — R0609 Other forms of dyspnea: Secondary | ICD-10-CM

## 2019-08-14 NOTE — Patient Instructions (Signed)

## 2019-10-25 ENCOUNTER — Other Ambulatory Visit: Payer: Self-pay | Admitting: Family Medicine

## 2020-01-16 ENCOUNTER — Other Ambulatory Visit: Payer: Self-pay | Admitting: Family Medicine

## 2020-01-30 ENCOUNTER — Other Ambulatory Visit: Payer: Self-pay | Admitting: Urology

## 2020-01-30 DIAGNOSIS — C679 Malignant neoplasm of bladder, unspecified: Secondary | ICD-10-CM

## 2020-02-04 IMAGING — CT CT ABD-PEL WO/W CM
3 of 12 series · 12 of 46 positions shown, 18 images · IV contrast (omnipaque)
Comparison: None.

CLINICAL DATA: Bladder cancer, s/p TURBT

EXAM:
CT ABDOMEN AND PELVIS WITHOUT AND WITH CONTRAST
TECHNIQUE: Multidetector CT imaging of the abdomen and pelvis was performed
following the standard protocol before and following the bolus
administration of intravenous contrast.
CONTRAST:  125mL OMNIPAQUE IOHEXOL 300 MG/ML  SOLN

[Series 2: abd pelvis pre · axial · non-contrast · 0.81mm/px · z∈[-1527,-1392]mm · 3 of 96 slices shown (1 of 2)]
[im 14/96  soft-tissue]
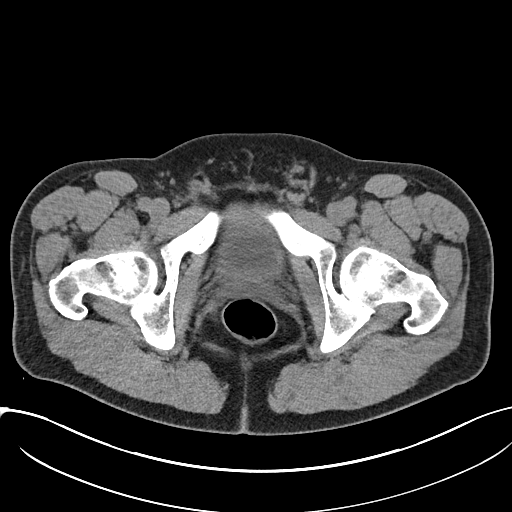
[im 28/96  soft-tissue]
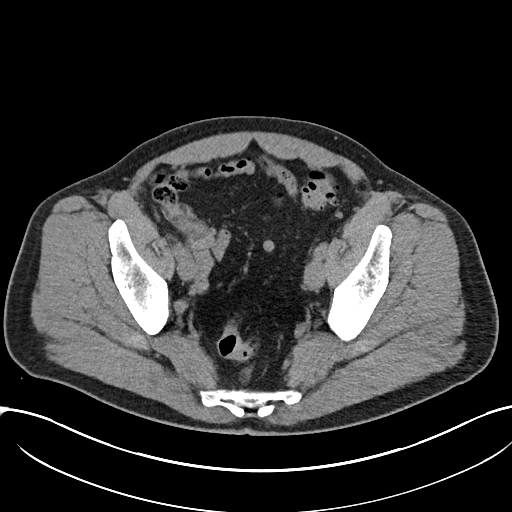
[im 41/96  soft-tissue]
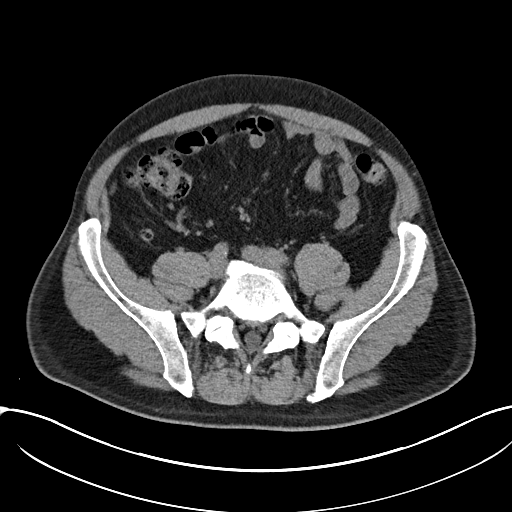

[Series 4: abd pelvis pre · coronal · non-contrast · 0.80mm/px · 2 of 156 slices shown, 3 images (2 of 2)]
[im 52/156  soft-tissue]
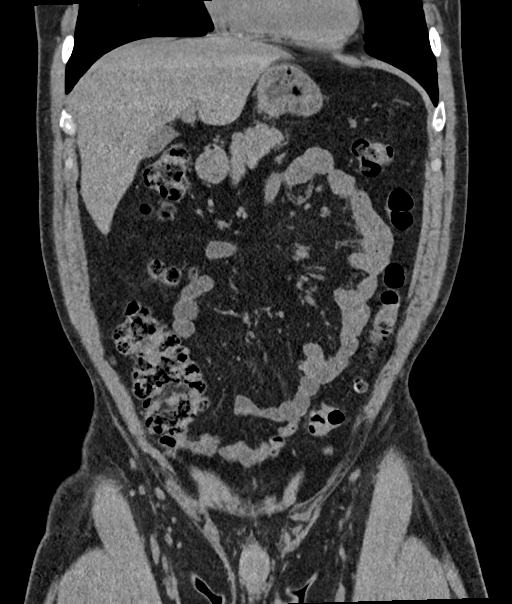
[im 52/156  bone]
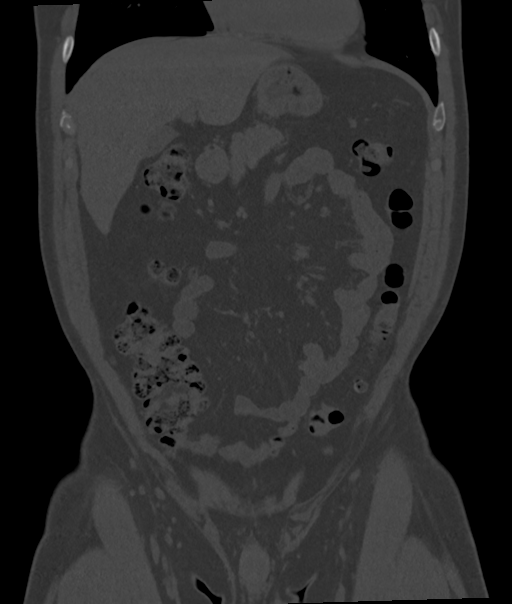
[im 104/156  soft-tissue]
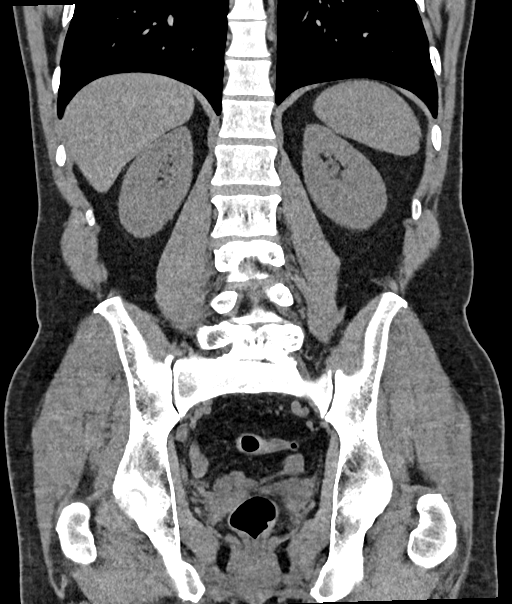

[Series 18: axial delay delay prone · axial · delayed · 0.90mm/px · z∈[-1535,-1115]mm · 7 of 113 slices shown, 12 images]
[im 15/113  soft-tissue]
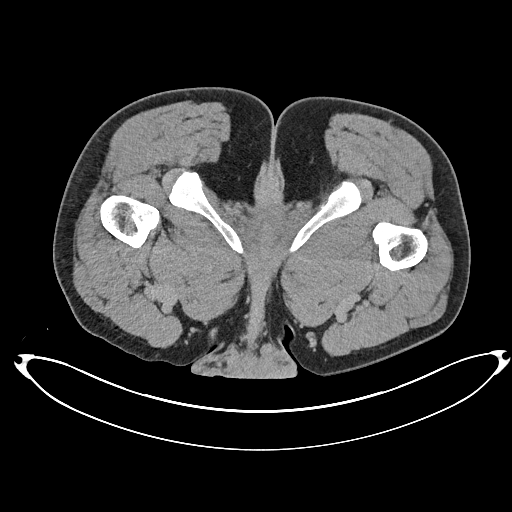
[im 15/113  bone]
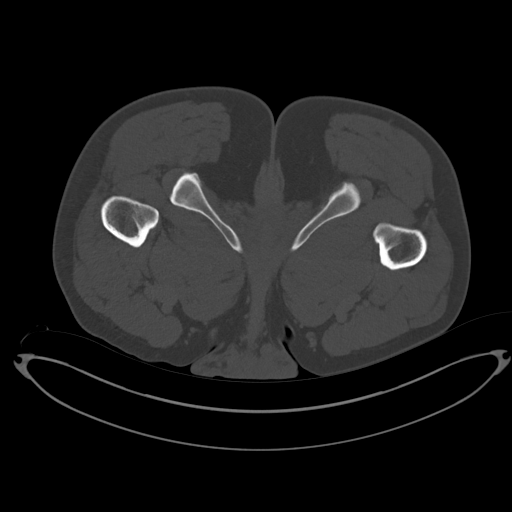
[im 29/113  soft-tissue]
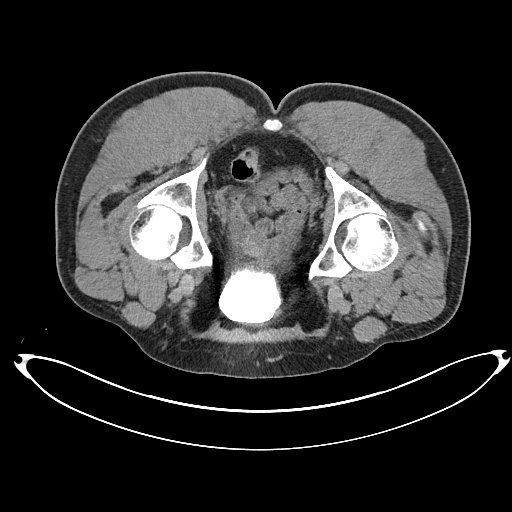
[im 43/113  soft-tissue]
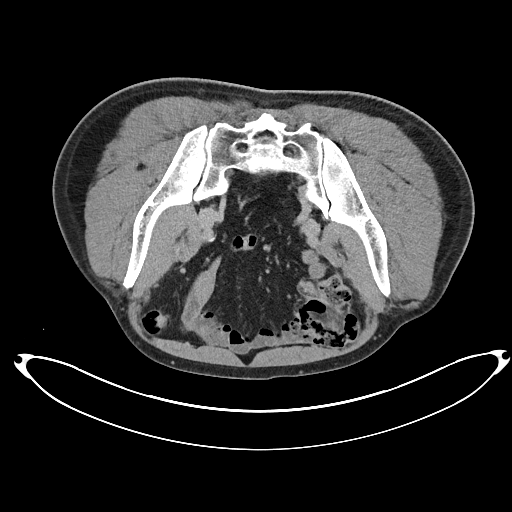
[im 57/113  soft-tissue]
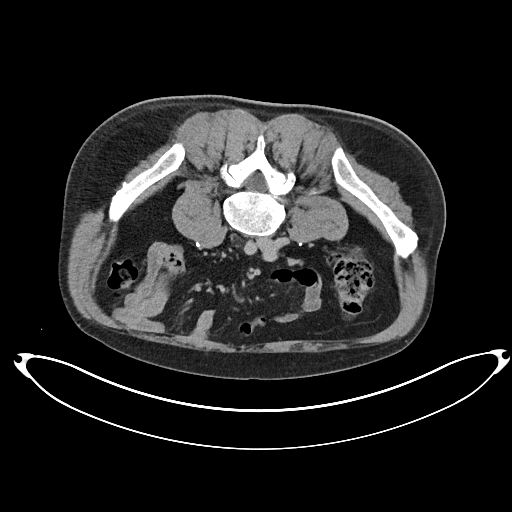
[im 57/113  lung]
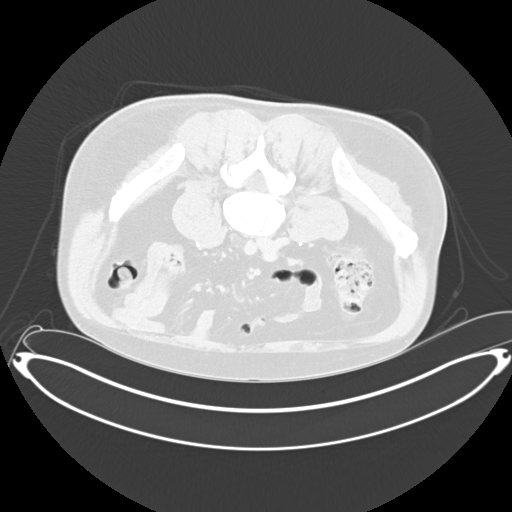
[im 71/113  soft-tissue]
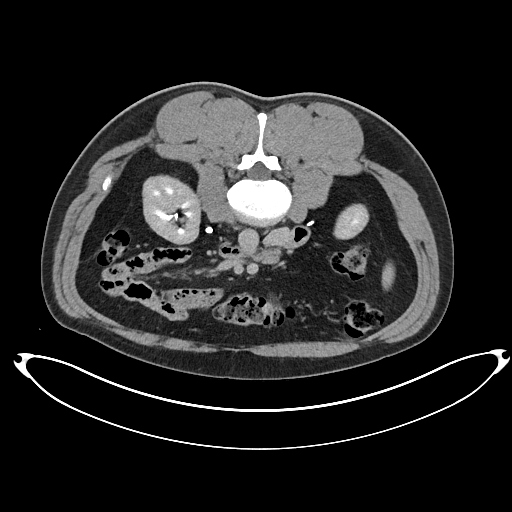
[im 71/113  lung]
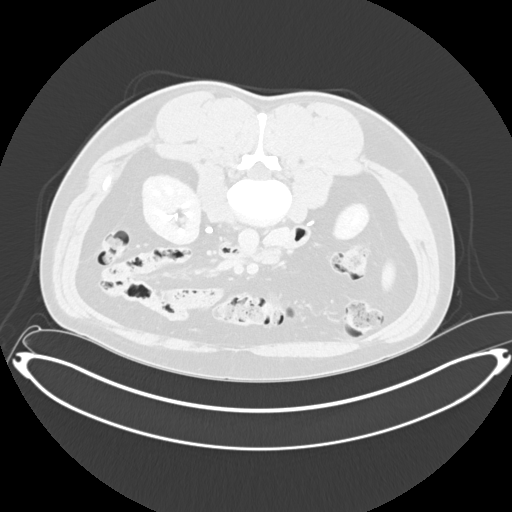
[im 85/113  soft-tissue]
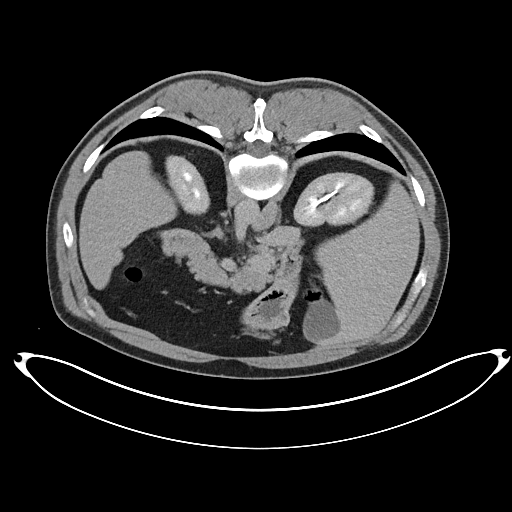
[im 85/113  lung]
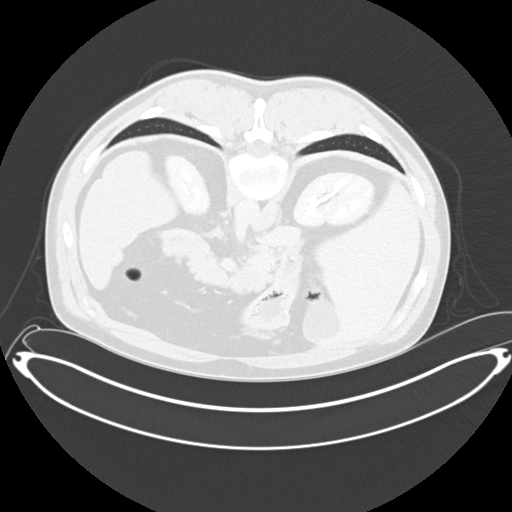
[im 99/113  soft-tissue]
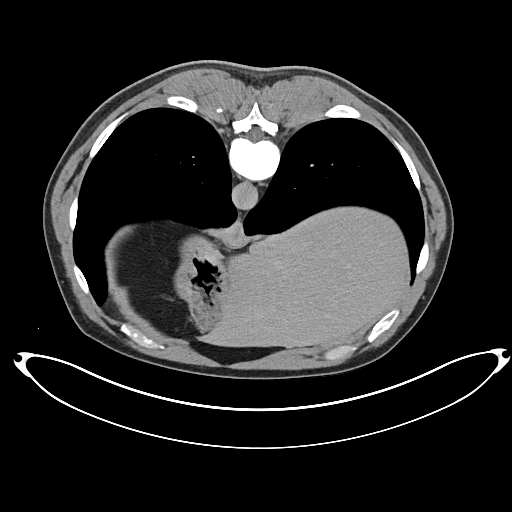
[im 99/113  lung]
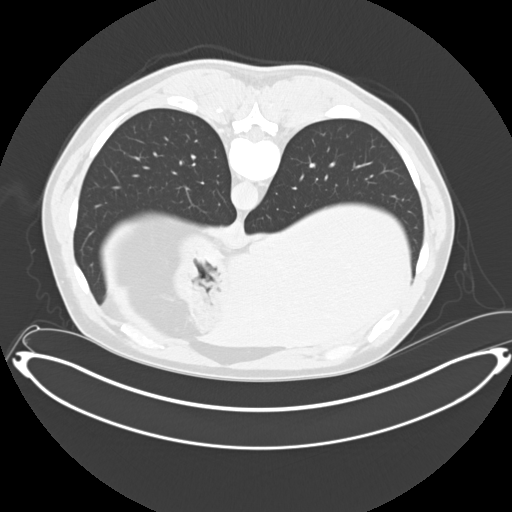

[12 of 46 positions shown; findings below may reference images not displayed]

FINDINGS: Lower chest: No acute abnormality.

Hepatobiliary: No solid liver abnormality is seen. No gallstones,
gallbladder wall thickening, or biliary dilatation.

Pancreas: Unremarkable. No pancreatic ductal dilatation or
surrounding inflammatory changes.

Spleen: Normal in size without significant abnormality.

Adrenals/Urinary Tract: Adrenal glands are unremarkable. Kidneys are
normal, without renal calculi, solid lesion, or hydronephrosis. Mild
urinary bladder wall thickening. No filling defect appreciated.

Stomach/Bowel: Stomach is within normal limits. Appendix appears
normal. No evidence of bowel wall thickening, distention, or
inflammatory changes. Sigmoid diverticulosis.

Vascular/Lymphatic: Aortic atherosclerosis. No enlarged abdominal or
pelvic lymph nodes.

Reproductive: Prostatomegaly.

Other: No abdominal wall hernia or abnormality. No abdominopelvic
ascites.

Musculoskeletal: No acute or significant osseous findings.
IMPRESSION: 1. Mild urinary bladder wall thickening, likely due to chronic
outlet obstruction and prostatomegaly. No internal filling defect
appreciated on delayed phase urogram.

2. No evidence invasive or metastatic disease in the abdomen or
pelvis.

3.  Sigmoid diverticulosis.

## 2020-02-09 ENCOUNTER — Other Ambulatory Visit: Payer: Self-pay | Admitting: Family Medicine

## 2020-02-09 NOTE — Telephone Encounter (Signed)
Requested medication (s) are due for refill today: yes  Requested medication (s) are on the active medication list: yes  Last refill:  05/14/20  Future visit scheduled: no  Notes to clinic:  med not delegated to NT   Requested Prescriptions  Pending Prescriptions Disp Refills   lamoTRIgine (LAMICTAL) 100 MG tablet [Pharmacy Med Name: LAMOTRIGINE 100 MG TAB] 90 tablet 1    Sig: TAKE ONE TABLET EVERY DAY      Not Delegated - Neurology:  Anticonvulsants Failed - 02/09/2020  1:10 PM      Failed - This refill cannot be delegated      Passed - HCT in normal range and within 360 days    Hematocrit  Date Value Ref Range Status  07/20/2019 45.0 37.5 - 51.0 % Final          Passed - HGB in normal range and within 360 days    Hemoglobin  Date Value Ref Range Status  07/20/2019 14.6 13.0 - 17.7 g/dL Final          Passed - PLT in normal range and within 360 days    Platelets  Date Value Ref Range Status  07/20/2019 263 150 - 450 x10E3/uL Final          Passed - WBC in normal range and within 360 days    WBC  Date Value Ref Range Status  07/20/2019 5.7 3.4 - 10.8 x10E3/uL Final          Passed - Valid encounter within last 12 months    Recent Outpatient Visits           6 months ago Hypogonadism in male   [facility], [place], [place]   9 months ago Gastroesophageal reflux disease, unspecified whether esophagitis present   [place], Lilia Argue, [place]   1 year ago SOB (shortness of breath)   [place], Lilia Argue, [place]   2 years ago PE (physical exam), annual   [facility], Jeannette How, MD   2 years ago Recurrent major depressive disorder, in full remission [facility])   [facility] [place], Jeannette How, MD

## 2020-02-11 NOTE — Telephone Encounter (Signed)
Previous RL patient. Patient last seen 07/20/19.

## 2020-02-12 NOTE — Telephone Encounter (Signed)
Pt stated he would call back to make this apt.

## 2020-02-19 ENCOUNTER — Ambulatory Visit
Admission: RE | Admit: 2020-02-19 | Discharge: 2020-02-19 | Disposition: A | Discharge status: Home / Self Care | Payer: PRIVATE HEALTH INSURANCE | Source: Ambulatory Visit | Attending: Urology | Admitting: Urology

## 2020-02-19 ENCOUNTER — Other Ambulatory Visit: Payer: Self-pay

## 2020-02-19 DIAGNOSIS — C679 Malignant neoplasm of bladder, unspecified: Secondary | ICD-10-CM | POA: Diagnosis not present

## 2020-02-19 MED ORDER — IOHEXOL 300 MG/ML  SOLN
100.0000 mL | Freq: Once | INTRAMUSCULAR | Status: AC | PRN
  Administered 2020-02-19: 100 mL via INTRAVENOUS

## 2020-03-19 ENCOUNTER — Other Ambulatory Visit: Payer: Self-pay

## 2020-03-19 ENCOUNTER — Ambulatory Visit: Payer: PRIVATE HEALTH INSURANCE | Admitting: Nurse Practitioner

## 2020-03-19 ENCOUNTER — Encounter: Payer: Self-pay | Admitting: Nurse Practitioner

## 2020-03-19 VITALS — BP 115/75 | HR 82 | Temp 97.7°F | Ht 73.0 in | Wt 219.4 lb

## 2020-03-19 DIAGNOSIS — F3342 Major depressive disorder, recurrent, in full remission: Secondary | ICD-10-CM

## 2020-03-19 DIAGNOSIS — G4733 Obstructive sleep apnea (adult) (pediatric): Secondary | ICD-10-CM | POA: Diagnosis not present

## 2020-03-19 DIAGNOSIS — F5104 Psychophysiologic insomnia: Secondary | ICD-10-CM | POA: Diagnosis not present

## 2020-03-19 DIAGNOSIS — G4700 Insomnia, unspecified: Secondary | ICD-10-CM | POA: Insufficient documentation

## 2020-03-19 MED ORDER — OMEPRAZOLE 20 MG PO CPDR: DELAYED_RELEASE_CAPSULE | ORAL | 4 refills | Status: AC

## 2020-03-19 MED ORDER — ESCITALOPRAM OXALATE 10 MG PO TABS: 10.0000 mg | ORAL_TABLET | Freq: Every day | ORAL | 4 refills | Status: AC

## 2020-03-19 MED ORDER — LAMOTRIGINE 100 MG PO TABS: 100.0000 mg | ORAL_TABLET | Freq: Every day | ORAL | 4 refills | Status: AC

## 2020-03-19 NOTE — Progress Notes (Signed)
BP 115/75    Pulse 82    Temp 97.7 F (36.5 C) (Oral)    Ht 6\' 1"  (1.854 m)    Wt 219 lb 6.4 oz (99.5 kg)    SpO2 99%    BMI 28.95 kg/m    Subjective:    Patient ID: Scott Chan, male    DOB: Feb 18, 1966, 54 y.o.   MRN: [id number]  HPI: Scott Chan is a 54 y.o. male  Chief Complaint  Patient presents with   Medication Refill   DEPRESSION Continues Lamictal and Lexapro.  Also takes Omeprazole for GERD. Mood status: stable Satisfied with current treatment?: yes Symptom severity: mild  Duration of current treatment : chronic Side effects: no Medication compliance: good compliance Psychotherapy/counseling: none Depressed mood: no Anxious mood: no Anhedonia: no Significant weight loss or gain: no Insomnia: yes hard to stay asleep -- he reports he used to take Ambien -- only used on occasion  --- discussed other options for use like Trazodone PRN, Gabapentin, Lunesta -- his wife is a Software engineer and he is going to reach out to her and then will [place] provider.  Reports he is working on obtaining CPAP. Fatigue: no Feelings of worthlessness or guilt: no Impaired concentration/indecisiveness: no Suicidal ideations: no Hopelessness: no Crying spells: no Depression screen [facility] 2/9 03/19/2020 07/20/2019 05/15/2019 09/13/2017 09/08/2016  Decreased Interest 0 0 0 0 0  Down, Depressed, Hopeless 0 0 0 0 0  PHQ - 2 Score 0 0 0 0 0  Altered sleeping 0 0 0 - -  Tired, decreased energy 0 0 1 - -  Change in appetite 0 0 0 - -  Feeling bad or failure about yourself  0 0 0 - -  Trouble concentrating 0 0 0 - -  Moving slowly or fidgety/restless 0 0 0 - -  Suicidal thoughts 0 0 0 - -  PHQ-9 Score 0 0 1 - -  Difficult doing work/chores Not difficult at all - Not difficult at all - -    Relevant past medical, surgical, family and social history reviewed and updated as indicated. Interim medical history since our last visit reviewed. Allergies and medications reviewed and  updated.  Review of Systems  Constitutional: Negative for activity change, diaphoresis, fatigue and fever.  Respiratory: Negative for cough, chest tightness, shortness of breath and wheezing.   Cardiovascular: Negative for chest pain, palpitations and leg swelling.  Skin: Negative.   Neurological: Negative.   Psychiatric/Behavioral: Positive for sleep disturbance. Negative for behavioral problems, decreased concentration, self-injury and suicidal ideas. The patient is not nervous/anxious.     Per HPI unless specifically indicated above     Objective:    BP 115/75    Pulse 82    Temp 97.7 F (36.5 C) (Oral)    Ht 6\' 1"  (1.854 m)    Wt 219 lb 6.4 oz (99.5 kg)    SpO2 99%    BMI 28.95 kg/m   Wt Readings from Last 3 Encounters:  03/19/20 219 lb 6.4 oz (99.5 kg)  08/14/19 221 lb 4 oz (100.4 kg)  07/20/19 221 lb (100.2 kg)    Physical Exam Vitals and nursing note reviewed.  Constitutional:      General: He is awake. He is not in acute distress.    Appearance: He is well-developed and well-groomed. He is not ill-appearing.  HENT:     Head: Normocephalic and atraumatic.     Right Ear: Hearing normal. No drainage.  Left Ear: Hearing normal. No drainage.  Eyes:     General: Lids are normal.        Right eye: No discharge.        Left eye: No discharge.     Conjunctiva/sclera: Conjunctivae normal.     Pupils: Pupils are equal, round, and reactive to light.  Neck:     Thyroid: No thyromegaly.     Vascular: No carotid bruit.     Trachea: Trachea normal.  Cardiovascular:     Rate and Rhythm: Normal rate and regular rhythm.     Heart sounds: Normal heart sounds, S1 normal and S2 normal. No murmur heard.  No gallop.   Pulmonary:     Effort: Pulmonary effort is normal. No accessory muscle usage or respiratory distress.     Breath sounds: Normal breath sounds.  Abdominal:     General: Bowel sounds are normal.     Palpations: Abdomen is soft.  Musculoskeletal:        General:  Normal range of motion.     Cervical back: Normal range of motion and neck supple.     Right lower leg: No edema.     Left lower leg: No edema.  Skin:    General: Skin is warm and dry.     Capillary Refill: Capillary refill takes less than 2 seconds.     Findings: No rash.  Neurological:     Mental Status: He is alert and oriented to person, place, and time.     Deep Tendon Reflexes: Reflexes are normal and symmetric.  Psychiatric:        Attention and Perception: Attention normal.        Mood and Affect: Mood normal.        Speech: Speech normal.        Behavior: Behavior normal. Behavior is cooperative.        Thought Content: Thought content normal.     Results for orders placed or performed in visit on 07/20/19  CBC with Differential/Platelet  Result Value Ref Range   WBC 5.7 3.4 - 10.8 x10E3/uL   RBC 5.71 4.14 - 5.80 x10E6/uL   Hemoglobin 14.6 13.0 - 17.7 g/dL   Hematocrit 45.0 37.5 - 51.0 %   MCV 79 79 - 97 fL   MCH 25.6 (L) 26.6 - 33.0 pg   MCHC 32.4 31 - 35 g/dL   RDW 15.7 (H) 11.6 - 15.4 %   Platelets 263 150 - 450 x10E3/uL   Neutrophils 67 Not Estab. %   Lymphs 22 Not Estab. %   Monocytes 9 Not Estab. %   Eos 1 Not Estab. %   Basos 1 Not Estab. %   Neutrophils Absolute 3.9 1.40 - 7.00 x10E3/uL   Lymphocytes Absolute 1.2 0 - 3 x10E3/uL   Monocytes Absolute 0.5 0 - 0 x10E3/uL   EOS (ABSOLUTE) 0.1 0.0 - 0.4 x10E3/uL   Basophils Absolute 0.0 0 - 0 x10E3/uL   Immature Granulocytes 0 Not Estab. %   Immature Grans (Abs) 0.0 0.0 - 0.1 x10E3/uL  Comprehensive metabolic panel  Result Value Ref Range   Glucose 85 65 - 99 mg/dL   BUN 16 6 - 24 mg/dL   Creatinine, Ser 1.05 0.76 - 1.27 mg/dL   GFR calc non Af Amer 81 >59 mL/min/1.73   GFR calc Af Amer 93 >59 mL/min/1.73   BUN/Creatinine Ratio 15 9 - 20   Sodium 139 134 - 144 mmol/L   Potassium 4.2 3.5 -  5.2 mmol/L   Chloride 103 96 - 106 mmol/L   CO2 22 20 - 29 mmol/L   Calcium 9.5 8.7 - 10.2 mg/dL   Total Protein  7.2 6.0 - 8.5 g/dL   Albumin 4.6 3.8 - 4.9 g/dL   Globulin, Total 2.6 1.5 - 4.5 g/dL   Albumin/Globulin Ratio 1.8 1.2 - 2.2   Bilirubin Total 0.8 0.0 - 1.2 mg/dL   Alkaline Phosphatase 111 39 - 117 IU/L   AST 25 0 - 40 IU/L   ALT 25 0 - 44 IU/L  Lipid Panel w/o Chol/HDL Ratio  Result Value Ref Range   Cholesterol, Total 164 100 - 199 mg/dL   Triglycerides 103 0 - 149 mg/dL   HDL 36 (L) >39 mg/dL   VLDL Cholesterol Cal 19 5 - 40 mg/dL   LDL Chol Calc (NIH) 109 (H) 0 - 99 mg/dL  TSH  Result Value Ref Range   TSH 1.620 0.450 - 4.500 uIU/mL  UA/M w/rflx Culture, Routine   Specimen: Urine   URINE  Result Value Ref Range   Specific Gravity, UA 1.015 1.005 - 1.030   pH, UA 5.5 5.0 - 7.5   Color, UA Yellow Yellow   Appearance Ur Clear Clear   Leukocytes,UA Negative Negative   Protein,UA Negative Negative/Trace   Glucose, UA Negative Negative   Ketones, UA Negative Negative   RBC, UA Negative Negative   Bilirubin, UA Negative Negative   Urobilinogen, Ur 0.2 0.2 - 1.0 mg/dL   Nitrite, UA Negative Negative  PSA  Result Value Ref Range   Prostate Specific Ag, Serum 0.5 0.0 - 4.0 ng/mL      Assessment & Plan:   Problem List Items Addressed This Visit      Respiratory   Sleep apnea    Is working on obtaining new CPAP.        Other   Depression - Primary    Chronic, stable on current regimen.  Denies SI/HI.  Will continue current medication regimen and adjust as needed.  Recommend labs next visit, check Lamictal level yearly.  Return in March for annual physical with new PCP.  Refills sent in today.      Relevant Medications   escitalopram (LEXAPRO) 10 MG tablet   Insomnia    Chronic issue, no current medications, previously took Ambien.  Does have OSA, no current CPAP.  Discussed alternate medications that are not in controlled medication family and less addictive element long term -- Melatonin, Gabapentin, Trazodone.  We also discussed other members in similar family as  Ambien, such as Lunesta.  His wife is a Software engineer and he is going to discuss with her options -- will reach out via MyChart to alert provider to which he would like sent in.  He is aware if controlled substance ordered he will need to sign controlled substance contract next visit and provide UDS yearly, which he agrees with.  Return in March for physical.          Follow up plan: Return in about 4 months (around 07/21/2020) for Annual physical.

## 2020-03-19 NOTE — Patient Instructions (Signed)

## 2020-03-19 NOTE — Assessment & Plan Note (Signed)
Chronic issue, no current medications, previously took Ambien.  Does have OSA, no current CPAP.  Discussed alternate medications that are not in controlled medication family and less addictive element long term -- Melatonin, Gabapentin, Trazodone.  We also discussed other members in similar family as Ambien, such as Lunesta.  His wife is a Software engineer and he is going to discuss with her options -- will reach out via MyChart to alert provider to which he would like sent in.  He is aware if controlled substance ordered he will need to sign controlled substance contract next visit and provide UDS yearly, which he agrees with.  Return in March for physical.

## 2020-03-19 NOTE — Assessment & Plan Note (Signed)
Chronic, stable on current regimen.  Denies SI/HI.  Will continue current medication regimen and adjust as needed.  Recommend labs next visit, check Lamictal level yearly.  Return in March for annual physical with new PCP.  Refills sent in today.

## 2020-03-19 NOTE — Assessment & Plan Note (Signed)
Is working on obtaining new CPAP.

## 2020-04-07 ENCOUNTER — Other Ambulatory Visit: Payer: PRIVATE HEALTH INSURANCE

## 2020-04-07 DIAGNOSIS — Z20822 Contact with and (suspected) exposure to covid-19: Secondary | ICD-10-CM

## 2020-04-08 LAB — SARS-COV-2, NAA 2 DAY TAT

## 2020-04-08 LAB — NOVEL CORONAVIRUS, NAA: SARS-CoV-2, NAA: NOT DETECTED

## 2021-04-10 ENCOUNTER — Other Ambulatory Visit: Payer: Self-pay | Admitting: Nurse Practitioner

## 2021-04-12 NOTE — Telephone Encounter (Signed)
Both were refilled  03/19/20 #90 4 RF enough until 2/23

## 2021-04-15 ENCOUNTER — Other Ambulatory Visit: Payer: Self-pay | Admitting: Nurse Practitioner

## 2021-04-18 NOTE — Telephone Encounter (Signed)
Both Lexapro and Prilosec filled 03/19/20 for #90 with 4 RF. Too early for refill and pt needs appointment. Sent pt message via MyChart to call office to schedule appointment.  Requested Prescriptions  Pending Prescriptions Disp Refills   escitalopram (LEXAPRO) 10 MG tablet [Pharmacy Med Name: ESCITALOPRAM OXALATE 10 MG TAB] 90 tablet 4    Sig: TAKE ONE TABLET (10 MG) BY MOUTH EVERY DAY     Psychiatry:  Antidepressants - SSRI Failed - 04/15/2021 11:42 AM      Failed - Completed PHQ-2 or PHQ-9 in the last 360 days      Failed - Valid encounter within last 6 months    Recent Outpatient Visits           1 year ago Recurrent major depressive disorder, in full remission ([place])   [place], Henrine Screws T, NP   1 year ago Hypogonadism in male   [facility], [place], [place]   1 year ago Gastroesophageal reflux disease, unspecified whether esophagitis present   [facility] [place], [place]   2 years ago SOB (shortness of breath)   [place], Lilia Argue, [place]   3 years ago PE (physical exam), annual   [place], Jeannette How, MD               omeprazole (PRILOSEC) 20 MG capsule [Pharmacy Med Name: OMEPRAZOLE DR 20 MG CAP] 90 capsule 4    Sig: TAKE 1 CAPSULE (20 MG) BY MOUTH EVERY DAY AS NEEDED     Gastroenterology: Proton Pump Inhibitors Failed - 04/15/2021 11:42 AM      Failed - Valid encounter within last 12 months    Recent Outpatient Visits           1 year ago Recurrent major depressive disorder, in full remission ([place])   [place], Henrine Screws T, NP   1 year ago Hypogonadism in male   [facility] [place], [place], [place]   1 year ago Gastroesophageal reflux disease, unspecified whether esophagitis present   [place], [place], [place]   2 years ago SOB (shortness of breath)   [place], [place]   3 years ago PE (physical exam), annual   [facility], Jeannette How, MD

## 2021-06-05 ENCOUNTER — Other Ambulatory Visit: Payer: Self-pay | Admitting: Urology

## 2021-06-05 DIAGNOSIS — R10A0 Flank pain, unspecified side: Secondary | ICD-10-CM

## 2021-06-05 DIAGNOSIS — R109 Unspecified abdominal pain: Secondary | ICD-10-CM

## 2021-06-05 DIAGNOSIS — Z8551 Personal history of malignant neoplasm of bladder: Secondary | ICD-10-CM

## 2021-06-18 ENCOUNTER — Ambulatory Visit: Payer: BC Managed Care – PPO

## 2021-07-30 ENCOUNTER — Ambulatory Visit: Admission: RE | Admit: 2021-07-30 | Payer: PRIVATE HEALTH INSURANCE | Source: Ambulatory Visit

## 2021-07-31 ENCOUNTER — Ambulatory Visit
Admission: RE | Admit: 2021-07-31 | Discharge: 2021-07-31 | Disposition: A | Discharge status: Home / Self Care | Payer: BC Managed Care – PPO | Source: Ambulatory Visit | Attending: Urology | Admitting: Urology

## 2021-07-31 ENCOUNTER — Other Ambulatory Visit: Payer: Self-pay

## 2021-07-31 DIAGNOSIS — Z8551 Personal history of malignant neoplasm of bladder: Secondary | ICD-10-CM | POA: Insufficient documentation

## 2021-07-31 DIAGNOSIS — R109 Unspecified abdominal pain: Secondary | ICD-10-CM | POA: Insufficient documentation

## 2021-07-31 MED ORDER — IOHEXOL 300 MG/ML  SOLN
125.0000 mL | Freq: Once | INTRAMUSCULAR | Status: AC | PRN
  Administered 2021-07-31: 125 mL via INTRAVENOUS

## 2022-07-14 NOTE — Progress Notes (Signed)
Date:  07/16/2022   ID:  Scott Chan, DOB 04/13/1966, MRN [id number]  Patient Location:  [place] [id number]   Provider location:   [facility], [place] office  PCP:  [place]  Cardiologist:  Arvid Right [facility]   Chief Complaint  Patient presents with   Follow-up    Patient c/o chest pain discomfort, faint feeling, rapid heart beats and shortness of breath while working in the yard about 1 week ago. Medications reviewed by the patient verbally.      History of Present Illness:    Scott Chan is a 57 y.o. male past medical history of Episodes of chest pain.  Near syncope SOB OSA, unable to afford CPAP athritis Depression Non smoker Fm hx of CAD Patient reports history of possible MI in brother in his 78s.  Stents in his father in his 30s.  Who presents today for follow-up of chest pain and shortness of breath  Last seen by myself in clinic March 2021 At baseline active, retired from police force, does lots around the house  Reports recent episode last week where he was Working in the garden, cutting branches, dragging branches Developed symptoms of tachycardia,SOB, lightheaded, difficult time recovering  Rare episodes where if he pushes himself hard he might have difficulty with shortness of breath, fast heartbeat, lightheadedness Does not feel he is having underlying cardiac arrhythmia  Poor fluid intake at baseline  Prior cardiac workup reviewed previous CT coronary calcium score, recent stress test, event monitor, no significant abnormalities noted  On prior office visit reported having episodes where he ran hard after his dog After a sprint, felt severe shortness of breath, almost felt like he was going to pass out  Typically active, in the past used to play basketball on a regular basis  EKG personally reviewed by myself on todays visit Shows normal sinus rhythm rate 82 bpm no significant  ST-T wave changes  Other past medical history reviewed Event Monitor Normal sinus rhythm Avg HR of 89 bpm.  Isolated SVEs were rare (<1.0%), SVE Couplets were rare (<1.0%), and SVE Triplets were rare (<1.0%). Isolated VEs were rare (<1.0%), and no VE Couplets or VE Triplets were present. Patient triggered events were not associated with significant arrhythmia. Legs weak  Stress test Showed no ischemia Minimal plaque in the aortic arch  CT coronary calcium score  01/2018 Coronary calcium score of 9. This was 28 percentile for age and sex matched control.  Stress test and echo previously ordered 07/08/2016, did not complete   Past Medical History:  Diagnosis Date   Cancer ([place])    Depression    GERD (gastroesophageal reflux disease)    History of kidney stones    h/o   Hypotension    Orbital fracture ([place]) 1986   Sleep apnea    no cpap   Past Surgical History:  Procedure Laterality Date   COLONOSCOPY WITH PROPOFOL N/A 09/28/2016   Procedure: COLONOSCOPY WITH PROPOFOL;  Surgeon: Lucilla Lame, MD;  Location: [facility] ENDOSCOPY;  Service: Endoscopy;  Laterality: N/A;   ESOPHAGOGASTRODUODENOSCOPY     nephrolithics  2003   NOSE SURGERY     TONSILLECTOMY  2007   TRANSURETHRAL RESECTION OF BLADDER TUMOR N/A 08/22/2018   Procedure: TRANSURETHRAL RESECTION OF BLADDER TUMOR (TURBT) BT;  Surgeon: Royston Cowper, MD;  Location: [facility] ORS;  Service: Urology;  Laterality: N/A;   TRANSURETHRAL RESECTION OF BLADDER TUMOR N/A 01/02/2019  Procedure: TRANSURETHRAL RESECTION OF BLADDER TUMOR (TURBT);  Surgeon: Royston Cowper, MD;  Location: [facility] ORS;  Service: Urology;  Laterality: N/A;     Current Meds  Medication Sig   escitalopram (LEXAPRO) 10 MG tablet Take 1 tablet (10 mg total) by mouth daily.   ibuprofen (ADVIL,MOTRIN) 200 MG tablet Take 800 mg by mouth every 6 (six) hours as needed for moderate pain.   lamoTRIgine (LAMICTAL) 100 MG tablet Take 1 tablet (100 mg total) by mouth daily.    Multiple Vitamin (MULTIVITAMIN) capsule Take 1 capsule by mouth daily.   omeprazole (PRILOSEC) 20 MG capsule TAKE 1 CAPSULE EVERY DAY AS NEEDED     Allergies:   Patient has no known allergies.   Social History   Tobacco Use   Smoking status: Never   Smokeless tobacco: Never  Vaping Use   Vaping Use: Never used  Substance Use Topics   Alcohol use: Yes    Alcohol/week: 2.0 standard drinks of alcohol    Types: 2 Cans of beer per week    Comment: occasional   Drug use: No     Current Outpatient Medications on File Prior to Visit  Medication Sig Dispense Refill   escitalopram (LEXAPRO) 10 MG tablet Take 1 tablet (10 mg total) by mouth daily. 90 tablet 4   ibuprofen (ADVIL,MOTRIN) 200 MG tablet Take 800 mg by mouth every 6 (six) hours as needed for moderate pain.     lamoTRIgine (LAMICTAL) 100 MG tablet Take 1 tablet (100 mg total) by mouth daily. 90 tablet 4   Multiple Vitamin (MULTIVITAMIN) capsule Take 1 capsule by mouth daily.     omeprazole (PRILOSEC) 20 MG capsule TAKE 1 CAPSULE EVERY DAY AS NEEDED 90 capsule 4   No current facility-administered medications on file prior to visit.     Family Hx: The patient's family history includes Heart attack in his brother; Hypertension in his brother and father; Ovarian cancer in his mother; Prostate cancer in his brother.  ROS:   Please see the history of present illness.    Review of Systems  Constitutional: Negative.   HENT: Negative.    Respiratory: Negative.    Cardiovascular: Negative.   Gastrointestinal: Negative.   Musculoskeletal: Negative.   Neurological: Negative.   Psychiatric/Behavioral: Negative.    All other systems reviewed and are negative.     Labs/Other Tests and Data Reviewed:    Recent Labs: No results found for requested labs within last 365 days.   Recent Lipid Panel Lab Results  Component Value Date/Time   CHOL 164 07/20/2019 09:56 AM   TRIG 103 07/20/2019 09:56 AM   HDL 36 (L) 07/20/2019  09:56 AM   CHOLHDL 4.4 09/13/2017 04:24 PM   LDLCALC 109 (H) 07/20/2019 09:56 AM    Wt Readings from Last 3 Encounters:  07/16/22 227 lb 8 oz (103.2 kg)  03/19/20 219 lb 6.4 oz (99.5 kg)  08/14/19 221 lb 4 oz (100.4 kg)     Exam:    Vital Signs: Vital signs may also be detailed in the HPI BP 130/80 (BP Location: Left Arm, Patient Position: Sitting, Cuff Size: Normal)   Pulse 82   Ht '6\' 1"'$  (1.854 m)   Wt 227 lb 8 oz (103.2 kg)   SpO2 98%   BMI 30.02 kg/m    Constitutional:  oriented to person, place, and time. No distress.  HENT:  Head: Grossly normal Eyes:  no discharge. No scleral icterus.  Neck: No JVD, no carotid bruits  Cardiovascular: Regular rate and rhythm, no murmurs appreciated Pulmonary/Chest: Clear to auscultation bilaterally, no wheezes or rails Abdominal: Soft.  no distension.  no tenderness.  Musculoskeletal: Normal range of motion Neurological:  normal muscle tone. Coordination normal. No atrophy Skin: Skin warm and dry Psychiatric: normal affect, pleasant  ASSESSMENT & PLAN:    Problem List Items Addressed This Visit   None Visit Diagnoses     Chest pain of uncertain etiology    -  Primary   Shortness of breath         Chest pain, shortness of breath Possibly secondary to conditioning and dehydration Prior cardiac workup reviewed CT coronary calcium score, stress test, event monitor, no significant abnormalities noted We have recommended if he has additional episodes, cardiac CTA could be ordered With moderate activity, work on conditioning, hydration   SOB (shortness of breath) In the setting of extreme exertion Recommend regular exercise program, hydration Baseline echocardiogram has been ordered   OSA (obstructive sleep apnea) Recently restarted wearing his CPAP   Gastroesophageal Reflux Disease, esophagitis presence not specified On PPI    Disposition: Follow-up  as needed   Signed, Ida Rogue, MD  07/16/2022 11:59 AM    [place] Office [id number], [place], [place] [address]

## 2022-07-16 ENCOUNTER — Ambulatory Visit: Payer: BC Managed Care – PPO | Attending: Cardiovascular Disease | Admitting: Cardiovascular Disease

## 2022-07-16 ENCOUNTER — Encounter: Payer: Self-pay | Admitting: Cardiovascular Disease

## 2022-07-16 VITALS — BP 130/80 | HR 82 | Ht 73.0 in | Wt 227.5 lb

## 2022-07-16 DIAGNOSIS — R0602 Shortness of breath: Secondary | ICD-10-CM

## 2022-07-16 DIAGNOSIS — R079 Chest pain, unspecified: Secondary | ICD-10-CM | POA: Diagnosis not present

## 2022-07-16 NOTE — Patient Instructions (Addendum)
Medication Instructions:  No changes  If you need a refill on your cardiac medications before your next appointment, please call your pharmacy.   Lab work: No new labs needed  Testing/Procedures: Your physician has requested that you have an echocardiogram. Echocardiography is a painless test that uses sound waves to create images of your heart. It provides your doctor with information about the size and shape of your heart and how well your heart's chambers and valves are working.   You may receive an ultrasound enhancing agent through an IV if needed to better visualize your heart during the echo. This procedure takes approximately one hour.  There are no restrictions for this procedure.  This will take place at [place] ([place]) [address], [place]   Follow-Up: At [facility], you and your health needs are our priority.  As part of our continuing mission to provide you with exceptional heart care, we have created designated Provider Care Teams.  These Care Teams include your primary Cardiologist (physician) and Advanced Practice Providers (APPs -  Physician Assistants and Nurse Practitioners) who all work together to provide you with the care you need, when you need it.  You will need a follow up appointment as needed  Providers on your designated Care Team:   Murray Hodgkins, NP Christell Faith, PA-C Cadence Kathlen Mody, [place]  COVID-19 Vaccine Information can be found at: [email or web address].uk For questions related to vaccine distribution or appointments, please email [email or web address] or call [phone].

## 2022-09-02 ENCOUNTER — Ambulatory Visit: Payer: BC Managed Care – PPO | Attending: Cardiovascular Disease

## 2022-09-02 DIAGNOSIS — R0602 Shortness of breath: Secondary | ICD-10-CM

## 2022-09-02 DIAGNOSIS — R079 Chest pain, unspecified: Secondary | ICD-10-CM | POA: Diagnosis not present

## 2022-09-02 LAB — ECHOCARDIOGRAM COMPLETE
AR max vel: 3.79 cm2
AV Area VTI: 3.84 cm2
AV Area mean vel: 3.76 cm2
AV Mean grad: 4 mmHg
AV Peak grad: 7 mmHg
Ao pk vel: 1.32 m/s
Area-P 1/2: 3.85 cm2
Calc EF: 59.6 %
S' Lateral: 3 cm
Single Plane A2C EF: 61 %
Single Plane A4C EF: 57.5 %

## 2022-11-22 ENCOUNTER — Ambulatory Visit: Payer: BC Managed Care – PPO | Admitting: Urology

## 2022-11-22 VITALS — BP 128/92 | HR 90 | Ht 73.0 in | Wt 210.0 lb

## 2022-11-22 DIAGNOSIS — Z8551 Personal history of malignant neoplasm of bladder: Secondary | ICD-10-CM

## 2022-11-22 DIAGNOSIS — C680 Malignant neoplasm of urethra: Secondary | ICD-10-CM

## 2022-11-22 DIAGNOSIS — R300 Dysuria: Secondary | ICD-10-CM

## 2022-11-22 LAB — MICROSCOPIC EXAMINATION

## 2022-11-22 LAB — URINALYSIS, COMPLETE
Bilirubin, UA: NEGATIVE
Glucose, UA: NEGATIVE
Ketones, UA: NEGATIVE
Leukocytes,UA: NEGATIVE
Nitrite, UA: NEGATIVE
Protein,UA: NEGATIVE
RBC, UA: NEGATIVE
Specific Gravity, UA: >1.03 — ABNORMAL HIGH (ref 1.005–1.030)
Urobilinogen, Ur: 0.2 mg/dL (ref 0.2–1.0)
pH, UA: 5 (ref 5.0–7.5)

## 2022-11-22 NOTE — Progress Notes (Signed)
I, Maysun L Gibbs,acting as a scribe for Riki Altes, MD.,have documented all relevant documentation on the behalf of Riki Altes, MD,as directed by  Riki Altes, MD while in the presence of Riki Altes, MD.  11/22/2022 4:11 PM   Scott Chan 01/06/1966 [id number]  Referring provider: Marisue Ivan, MD [phone] [facility] [address] [facility] [place],  [place] [address]  Chief Complaint  Patient presents with   New Patient (Initial Visit)    HPI: Scott Chan is a 57 y.o. male presents to transfer urologic care.   Previously followed by Dr. Evelene Croon for bladder cancer  He has requested his records for transfer Review of [facility] records are remarkable for TURBT April 2020, which showed partially denuded urothelial mucosa, negative for atypia or malignancy. TURBT August 2020 remarkable for superficial high-grade papillary urothelial carcinoma.  He did not receive BCG and has had undergone surveillance cystoscopy. He states at his last cystoscopy September 2023 he was released for annual surveillance and was scheduled for cysto in mid-September 2024.  He does have moderate lower urinary tract symptoms and has been on tamsulosin, but symptoms not bothersome enough that he desired a regular medication and tamsulosin side effects.  No dysuria or gross hematuria.    PMH: Past Medical History:  Diagnosis Date   Cancer (HCC)    Depression    GERD (gastroesophageal reflux disease)    History of kidney stones    h/o   Hypotension    Orbital fracture (HCC) 1986   Sleep apnea    no cpap    Surgical History: Past Surgical History:  Procedure Laterality Date   COLONOSCOPY WITH PROPOFOL N/A 09/28/2016   Procedure: COLONOSCOPY WITH PROPOFOL;  Surgeon: Midge Minium, MD;  Location: [facility] ENDOSCOPY;  Service: Endoscopy;  Laterality: N/A;   ESOPHAGOGASTRODUODENOSCOPY     nephrolithics  2003   NOSE SURGERY     TONSILLECTOMY  2007   TRANSURETHRAL RESECTION OF  BLADDER TUMOR N/A 08/22/2018   Procedure: TRANSURETHRAL RESECTION OF BLADDER TUMOR (TURBT) BT;  Surgeon: Orson Ape, MD;  Location: [facility] ORS;  Service: Urology;  Laterality: N/A;   TRANSURETHRAL RESECTION OF BLADDER TUMOR N/A 01/02/2019   Procedure: TRANSURETHRAL RESECTION OF BLADDER TUMOR (TURBT);  Surgeon: Orson Ape, MD;  Location: [facility] ORS;  Service: Urology;  Laterality: N/A;    Home Medications:  Allergies as of 11/22/2022   No Known Allergies      Medication List        Accurate as of November 22, 2022  4:11 PM. If you have any questions, ask your nurse or doctor.          escitalopram 10 MG tablet Commonly known as: LEXAPRO Take 1 tablet (10 mg total) by mouth daily.   ibuprofen 200 MG tablet Commonly known as: ADVIL Take 800 mg by mouth every 6 (six) hours as needed for moderate pain.   lamoTRIgine 100 MG tablet Commonly known as: LAMICTAL Take 1 tablet (100 mg total) by mouth daily.   multivitamin capsule Take 1 capsule by mouth daily.   omeprazole 20 MG capsule Commonly known as: PRILOSEC TAKE 1 CAPSULE EVERY DAY AS NEEDED        Allergies: No Known Allergies  Family History: Family History  Problem Relation Age of Onset   Hypertension Father    Ovarian cancer Mother    Hypertension Brother    Prostate cancer Brother    Heart attack Brother  Social History:  reports that he has never smoked. He has never used smokeless tobacco. He reports current alcohol use of about 2.0 standard drinks of alcohol per week. He reports that he does not use drugs.   Physical Exam: BP (!) 128/92   Pulse 90   Ht 6\' 1"  (1.854 m)   Wt 210 lb (95.3 kg)   BMI 27.71 kg/m   Constitutional:  Alert and oriented, No acute distress. HEENT: [place] AT, moist mucus membranes.  Trachea midline, no masses. Cardiovascular: No clubbing, cyanosis, or edema. Respiratory: Normal respiratory effort, no increased work of breathing. GI: Abdomen is soft, nontender, nondistended,  no abdominal masses Skin: No rashes, bruises or suspicious lesions. Neurologic: Grossly intact, no focal deficits, moving all 4 extremities. Psychiatric: Normal mood and affect.   Urinalysis Dipstick/microscopy negative   Assessment & Plan:    1. History of urothelial carcinoma of the bladder Schedule surveillance cystoscopy September 2024 He has tried on numerous occasions to contact the company who is managing Dr. Amada Jupiter previous records and has not been successful in getting in touch with them.   I have reviewed the above documentation for accuracy and completeness, and I agree with the above.   Riki Altes, MD  [facility] [address], [address] [place], [place] [address] [phone]

## 2022-11-23 ENCOUNTER — Encounter: Payer: Self-pay | Admitting: Urology

## 2023-01-31 ENCOUNTER — Ambulatory Visit: Payer: BC Managed Care – PPO | Admitting: Urology

## 2023-01-31 VITALS — BP 125/86 | HR 76

## 2023-01-31 DIAGNOSIS — Z8551 Personal history of malignant neoplasm of bladder: Secondary | ICD-10-CM

## 2023-01-31 LAB — URINALYSIS, COMPLETE
Bilirubin, UA: NEGATIVE
Glucose, UA: NEGATIVE
Ketones, UA: NEGATIVE
Leukocytes,UA: NEGATIVE
Nitrite, UA: NEGATIVE
Protein,UA: NEGATIVE
RBC, UA: NEGATIVE
Specific Gravity, UA: <1.005 — ABNORMAL LOW (ref 1.005–1.030)
Urobilinogen, Ur: 0.2 mg/dL (ref 0.2–1.0)
pH, UA: 5.5 (ref 5.0–7.5)

## 2023-01-31 LAB — MICROSCOPIC EXAMINATION
Bacteria, UA: NONE SEEN
RBC, Urine: NONE SEEN /HPF (ref 0–2)

## 2023-01-31 MED ORDER — URIBEL 118 MG PO CAPS: 118.0000 mg | ORAL_CAPSULE | Freq: Every day | ORAL | 0 refills | Status: AC

## 2023-01-31 MED ORDER — ALFUZOSIN HCL ER 10 MG PO TB24: 10.0000 mg | ORAL_TABLET | Freq: Every day | ORAL | 1 refills | Status: AC

## 2023-01-31 NOTE — Progress Notes (Unsigned)
   01/31/23  CC:  Chief Complaint  Patient presents with   Cysto    HPI:  Blood pressure 125/86, pulse 76. NED. A&Ox3.   No respiratory distress   Abd soft, NT, ND Normal phallus with bilateral descended testicles  Cystoscopy Procedure Note  Patient identification was confirmed, informed consent was obtained, and patient was prepped using Betadine solution.  Lidocaine jelly was administered per urethral meatus.     Pre-Procedure: - Inspection reveals a normal caliber ureteral meatus.  Procedure: The flexible cystoscope was introduced without difficulty - No urethral strictures/lesions are present. - {Blank multiple:19197::"Enlarged","Surgically absent","Normal"} prostate *** - {Blank multiple:19197::"Normal","Elevated","Tight"} bladder neck - Bilateral ureteral orifices identified - Bladder mucosa  reveals no ulcers, tumors, or lesions - No bladder stones - No trabeculation  Retroflexion shows ***   Post-Procedure: - Patient tolerated the procedure well  Assessment/ Plan:   No follow-ups on file.  Riki Altes, MD

## 2023-02-02 ENCOUNTER — Encounter: Payer: Self-pay | Admitting: Urology

## 2024-02-06 ENCOUNTER — Other Ambulatory Visit: Payer: BC Managed Care – PPO | Admitting: Urology

## 2024-02-08 ENCOUNTER — Other Ambulatory Visit: Payer: Self-pay | Admitting: Urology

## 2024-03-07 ENCOUNTER — Ambulatory Visit: Admitting: Urology

## 2024-03-07 ENCOUNTER — Encounter: Payer: Self-pay | Admitting: Urology

## 2024-03-07 VITALS — BP 129/87 | HR 99 | Ht 72.0 in | Wt 215.0 lb

## 2024-03-07 DIAGNOSIS — Z8551 Personal history of malignant neoplasm of bladder: Secondary | ICD-10-CM | POA: Diagnosis not present

## 2024-03-07 LAB — URINALYSIS, COMPLETE
Bilirubin, UA: NEGATIVE
Glucose, UA: NEGATIVE
Ketones, UA: NEGATIVE
Leukocytes,UA: NEGATIVE
Nitrite, UA: NEGATIVE
Protein,UA: NEGATIVE
RBC, UA: NEGATIVE
Specific Gravity, UA: 1.03 (ref 1.005–1.030)
Urobilinogen, Ur: 0.2 mg/dL (ref 0.2–1.0)
pH, UA: 6 (ref 5.0–7.5)

## 2024-03-07 LAB — MICROSCOPIC EXAMINATION

## 2024-03-07 NOTE — Progress Notes (Signed)
   03/07/24  CC:  Chief Complaint  Patient presents with   Cysto   Urologic history: TURBT 08/22/2018 Dr. Kassie for 5 and 10 mm papillary bladder tumors bladder tumors; path negative for atypia or malignancy; received post resection mitomycin  TURBT 01/02/2023 a recurrent 10 mm papillary tumor; pathology superficial strips of high-grade papillary urothelial carcinoma.  No lamina propria or muscularis seen.  Was a monitor with surveillance and no BCG or additional chemo administered  HPI: 58 year old male presents for annual surveillance cystoscopy  Blood pressure 125/86, pulse 76. NED. A&Ox3.   No respiratory distress   Abd soft, NT, ND Normal phallus with bilateral descended testicles  Cystoscopy Procedure Note  Patient identification was confirmed, informed consent was obtained, and patient was prepped using Betadine solution.  Lidocaine  jelly was administered per urethral meatus.     Pre-Procedure: - Inspection reveals a normal caliber urethral meatus.  Procedure: The flexible cystoscope was introduced without difficulty - No urethral strictures/lesions are present. -Mild lateral lobe enlargement prostate  - Mild elevation bladder neck - Bilateral ureteral orifices identified - Bladder mucosa  reveals no ulcers, tumors, or lesions - No bladder stones - No trabeculation  Retroflexion shows no intravesical median lobe or tumor   Post-Procedure: - Patient tolerated the procedure well  Assessment/ Plan: No evidence of recurrent bladder tumor Follow-up surveillance cystoscopy 1 year    Scott JAYSON Barba, MD

## 2025-03-08 ENCOUNTER — Other Ambulatory Visit: Admitting: Urology
